# Patient Record
Sex: Male | Born: 1965
Health system: Southern US, Community
[De-identification: ages and names within clinical notes are randomized; demographics above are authoritative.]

## PROBLEM LIST (undated history)

## (undated) DIAGNOSIS — N289 Disorder of kidney and ureter, unspecified: Secondary | ICD-10-CM

## (undated) DIAGNOSIS — I10 Essential (primary) hypertension: Secondary | ICD-10-CM

---

## 2018-04-29 ENCOUNTER — Encounter (HOSPITAL_COMMUNITY): Payer: Self-pay

## 2018-04-29 ENCOUNTER — Emergency Department (HOSPITAL_COMMUNITY): Payer: Managed Care, Other (non HMO)

## 2018-04-29 ENCOUNTER — Other Ambulatory Visit: Payer: Self-pay

## 2018-04-29 ENCOUNTER — Emergency Department (HOSPITAL_COMMUNITY)
Admission: EM | Admit: 2018-04-29 | Discharge: 2018-04-30 | Disposition: A | Discharge status: Home / Self Care | Payer: Managed Care, Other (non HMO) | Attending: Emergency Medicine | Admitting: Emergency Medicine

## 2018-04-29 DIAGNOSIS — I10 Essential (primary) hypertension: Secondary | ICD-10-CM | POA: Insufficient documentation

## 2018-04-29 DIAGNOSIS — R7989 Other specified abnormal findings of blood chemistry: Secondary | ICD-10-CM | POA: Diagnosis not present

## 2018-04-29 DIAGNOSIS — R109 Unspecified abdominal pain: Secondary | ICD-10-CM | POA: Diagnosis present

## 2018-04-29 DIAGNOSIS — N201 Calculus of ureter: Secondary | ICD-10-CM | POA: Insufficient documentation

## 2018-04-29 HISTORY — DX: Essential (primary) hypertension: I10

## 2018-04-29 LAB — CBC WITH DIFFERENTIAL/PLATELET
ABS IMMATURE GRANULOCYTES: 0.1 10*3/uL (ref 0.0–0.1)
Basophils Absolute: 0.1 10*3/uL (ref 0.0–0.1)
Basophils Relative: 0 %
Eosinophils Absolute: 0.1 10*3/uL (ref 0.0–0.7)
Eosinophils Relative: 1 %
HEMATOCRIT: 43.7 % (ref 39.0–52.0)
HEMOGLOBIN: 14.7 g/dL (ref 13.0–17.0)
IMMATURE GRANULOCYTES: 0 %
LYMPHS ABS: 2.4 10*3/uL (ref 0.7–4.0)
LYMPHS PCT: 17 %
MCH: 31.5 pg (ref 26.0–34.0)
MCHC: 33.6 g/dL (ref 30.0–36.0)
MCV: 93.8 fL (ref 78.0–100.0)
MONOS PCT: 7 %
Monocytes Absolute: 1 10*3/uL (ref 0.1–1.0)
NEUTROS ABS: 10 10*3/uL — AB (ref 1.7–7.7)
NEUTROS PCT: 75 %
Platelets: 265 10*3/uL (ref 150–400)
RBC: 4.66 MIL/uL (ref 4.22–5.81)
RDW: 11.9 % (ref 11.5–15.5)
WBC: 13.5 10*3/uL — AB (ref 4.0–10.5)

## 2018-04-29 LAB — COMPREHENSIVE METABOLIC PANEL
ALT: 43 U/L (ref 0–44)
ANION GAP: 8 (ref 5–15)
AST: 30 U/L (ref 15–41)
Albumin: 4 g/dL (ref 3.5–5.0)
Alkaline Phosphatase: 57 U/L (ref 38–126)
BUN: 19 mg/dL (ref 6–20)
CO2: 22 mmol/L (ref 22–32)
Calcium: 9.2 mg/dL (ref 8.9–10.3)
Chloride: 107 mmol/L (ref 98–111)
Creatinine, Ser: 1.89 mg/dL — ABNORMAL HIGH (ref 0.61–1.24)
GFR, EST AFRICAN AMERICAN: 46 mL/min — AB (ref >60)
GFR, EST NON AFRICAN AMERICAN: 40 mL/min — AB (ref >60)
GLUCOSE: 128 mg/dL — AB (ref 70–99)
POTASSIUM: 4 mmol/L (ref 3.5–5.1)
SODIUM: 137 mmol/L (ref 135–145)
TOTAL PROTEIN: 6.2 g/dL — AB (ref 6.5–8.1)
Total Bilirubin: 0.5 mg/dL (ref 0.3–1.2)

## 2018-04-29 LAB — URINALYSIS, ROUTINE W REFLEX MICROSCOPIC
BILIRUBIN URINE: NEGATIVE
GLUCOSE, UA: NEGATIVE mg/dL
Ketones, ur: NEGATIVE mg/dL
LEUKOCYTES UA: NEGATIVE
NITRITE: NEGATIVE
PROTEIN: 100 mg/dL — AB
Specific Gravity, Urine: 1.025 (ref 1.005–1.030)
pH: 6 (ref 5.0–8.0)

## 2018-04-29 MED ORDER — OXYCODONE-ACETAMINOPHEN 5-325 MG PO TABS
2.0000 | ORAL_TABLET | Freq: Once | ORAL | Status: DC
  Filled 2018-04-29: qty 2

## 2018-04-29 MED ORDER — ONDANSETRON 4 MG PO TBDP: 8.0000 mg | ORAL_TABLET | Freq: Once | ORAL | Status: DC

## 2018-04-29 MED ORDER — SODIUM CHLORIDE 0.9 % IV BOLUS
500.0000 mL | Freq: Once | INTRAVENOUS | Status: AC
  Administered 2018-04-29: 500 mL via INTRAVENOUS

## 2018-04-29 MED ORDER — HYDROMORPHONE HCL 1 MG/ML IJ SOLN: 1.0000 mg | Freq: Once | INTRAMUSCULAR | Status: DC

## 2018-04-29 MED ORDER — HYDROMORPHONE HCL 1 MG/ML IJ SOLN
1.0000 mg | Freq: Once | INTRAMUSCULAR | Status: AC
  Administered 2018-04-29: 1 mg via INTRAVENOUS
  Filled 2018-04-29: qty 1

## 2018-04-29 MED ORDER — ONDANSETRON HCL 4 MG/2ML IJ SOLN
4.0000 mg | Freq: Once | INTRAMUSCULAR | Status: AC
  Administered 2018-04-29: 4 mg via INTRAVENOUS
  Filled 2018-04-29: qty 2

## 2018-04-29 NOTE — ED Provider Notes (Signed)
[facility] EMERGENCY DEPARTMENT Provider Note   CSN: [id number] Arrival date & time: 04/29/18  2030    History   Chief Complaint Chief Complaint  Patient presents with  . Flank Pain  . Hematuria    HPI Corey Landry is a 52 y.o. male.   52 year old male presents to the emergency department for evaluation of left flank pain.  States that he had pain initially on Saturday which lasted for approximately 45 minutes before spontaneously resolving.  Pain began again today at 1400.  It has been constant since this time.  He has taken ibuprofen for symptoms without relief.  Did notice some hematuria at home.  Further reports some discomfort at the end of voiding.  Denies any fevers, vomiting, history of abdominal surgeries.  Mother with history of kidney stones.     Past Medical History:  Diagnosis Date  . Hypertension     There are no active problems to display for this patient.   History reviewed. No pertinent surgical history.      Home Medications    Prior to Admission medications   Medication Sig Start Date End Date Taking? Authorizing Provider  oxyCODONE-acetaminophen (PERCOCET/ROXICET) 5-325 MG tablet Take 1-2 tablets by mouth every 6 (six) hours as needed for severe pain. 04/30/18   Antony Madura, PA-C  promethazine (PHENERGAN) 25 MG tablet Take 1 tablet (25 mg total) by mouth every 6 (six) hours as needed for nausea or vomiting. 04/30/18   Antony Madura, PA-C  tamsulosin (FLOMAX) 0.4 MG CAPS capsule Take 1 capsule (0.4 mg total) by mouth daily. 04/30/18   Antony Madura, PA-C    Family History History reviewed. No pertinent family history.  Social History Social History   Tobacco Use  . Smoking status: Never Smoker  Substance Use Topics  . Alcohol use: Not on file  . Drug use: Never     Allergies   Penicillins   Review of Systems Review of Systems Ten systems reviewed and are negative for acute change, except as noted in the HPI.     Physical Exam Updated Vital Signs BP (!) 153/94 (BP Location: Right Arm)   Pulse 69   Temp (!) 97.5 F (36.4 C) (Oral)   Resp 20   Ht 5\' 9"  (1.753 m)   Wt 99.8 kg   SpO2 100%   BMI 32.49 kg/m   Physical Exam  Constitutional: He is oriented to person, place, and time. He appears well-developed and well-nourished. No distress.  Nontoxic appearing and in NAD  HENT:  Head: Normocephalic and atraumatic.  Eyes: Conjunctivae and EOM are normal. No scleral icterus.  Neck: Normal range of motion.  Cardiovascular: Normal rate, regular rhythm and intact distal pulses.  Pulmonary/Chest: Effort normal. No stridor. No respiratory distress.  Respirations even and unlabored  Abdominal: Soft. He exhibits no mass.  Soft, obese abdomen. Mild L abdominal tenderness.  Musculoskeletal: Normal range of motion.  Neurological: He is alert and oriented to person, place, and time. He exhibits normal muscle tone. Coordination normal.  Skin: Skin is warm and dry. No rash noted. He is not diaphoretic. No erythema. No pallor.  Psychiatric: He has a normal mood and affect. His behavior is normal.  Nursing note and vitals reviewed.    ED Treatments / Results  Labs (all labs ordered are listed, but only abnormal results are displayed) Labs Reviewed  CBC WITH DIFFERENTIAL/PLATELET - Abnormal; Notable for the following components:      Result Value   WBC  13.5 (*)    Neutro Abs 10.0 (*)    All other components within normal limits  URINALYSIS, ROUTINE W REFLEX MICROSCOPIC - Abnormal; Notable for the following components:   APPearance CLOUDY (*)    Hgb urine dipstick LARGE (*)    Protein, ur 100 (*)    RBC / HPF >50 (*)    Bacteria, UA RARE (*)    All other components within normal limits  COMPREHENSIVE METABOLIC PANEL - Abnormal; Notable for the following components:   Glucose, Bld 128 (*)    Creatinine, Ser 1.89 (*)    Total Protein 6.2 (*)    GFR calc non Af Amer 40 (*)    GFR calc Af Amer  46 (*)    All other components within normal limits    EKG None  Radiology Ct Renal Stone Study  Result Date: 04/29/2018 CLINICAL DATA:  Left flank pain x2 days EXAM: CT ABDOMEN AND PELVIS WITHOUT CONTRAST TECHNIQUE: Multidetector CT imaging of the abdomen and pelvis was performed following the standard protocol without IV contrast. COMPARISON:  None. FINDINGS: Lower chest: Mild scarring/atelectasis in the medial right middle lobe. Hepatobiliary: Moderate hepatic steatosis. Gallbladder is unremarkable. No intrahepatic or extrahepatic ductal dilatation. Pancreas: Within normal limits. Spleen: Within normal limits. Adrenals/Urinary Tract: Adrenal glands are within normal limits. Right kidney is within normal limits. Punctate nonobstructing calculus in the left upper kidney (coronal image 75). Mild left hydroureteronephrosis. Associated 5 x 3 mm distal left ureteral calculus at the UVJ (series 3/image 86). Bladder is underdistended. Stomach/Bowel: Stomach is within normal limits. No evidence of bowel obstruction. Normal appendix (series 3/image 57). Mild sigmoid diverticulosis, without evidence of diverticulitis. Vascular/Lymphatic: No evidence of abdominal aortic aneurysm. Atherosclerotic calcifications of the abdominal aorta and branch vessels. No suspicious abdominopelvic lymphadenopathy. Reproductive: Prostate is unremarkable. Other: No abdominopelvic ascites. Tiny fat containing bilateral inguinal hernias (series 3/image 90). Musculoskeletal: Visualized osseous structures are within normal limits. IMPRESSION: 5 x 3 mm distal left ureteral calculus at the UVJ. Mild left hydroureteronephrosis. Additional punctate nonobstructing left upper pole renal calculus. Moderate hepatic steatosis. Electronically Signed   By: Charline Bills M.D.   On: 04/29/2018 21:56    Procedures Procedures (including critical care time)  Medications Ordered in ED Medications  HYDROmorphone (DILAUDID) 1 MG/ML injection  (has no administration in time range)  ketorolac (TORADOL) 30 MG/ML injection (has no administration in time range)  HYDROmorphone (DILAUDID) injection 1 mg (1 mg Intravenous Given 04/29/18 2347)  sodium chloride 0.9 % bolus 500 mL (0 mLs Intravenous Stopped 04/30/18 0107)  ondansetron (ZOFRAN) injection 4 mg (4 mg Intravenous Given 04/29/18 2347)  HYDROmorphone (DILAUDID) injection 1 mg (1 mg Intravenous Given 04/30/18 0132)  ketorolac (TORADOL) 30 MG/ML injection 15 mg (15 mg Intravenous Given 04/30/18 0130)    1:30 AM Pain improved to 4/10, but requesting additional medications. Will give low dose Toradol and additional IV Dilaudid.  1:48 AM Pain has improved to 2/10. Expresses comfort with discharge and outpatient follow up   Initial Impression / Assessment and Plan / ED Course  I have reviewed the triage vital signs and the nursing notes.  Pertinent labs & imaging results that were available during my care of the patient were reviewed by me and considered in my medical decision making (see chart for details).     Patient has been diagnosed with a kidney stone via CT. There is no evidence of significant hydronephrosis, vitals sign stable and the pt does not have irratractable vomiting.  Creatinine is elevated, but with unknown baseline.  Likely partially related to obstructing ureterolithiasis.  Will have patient have this rechecked by his PCP as an outpatient.  Patient will be discharged home with pain medications and Flomax.  Return precautions discussed and provided.  Patient discharged in stable condition with no unaddressed concerns.   Final Clinical Impressions(s) / ED Diagnoses   Final diagnoses:  Left flank pain  Ureterolithiasis  Elevated serum creatinine    ED Discharge Orders         Ordered    tamsulosin (FLOMAX) 0.4 MG CAPS capsule  Daily     04/30/18 0146    oxyCODONE-acetaminophen (PERCOCET/ROXICET) 5-325 MG tablet  Every 6 hours PRN     04/30/18 0146     promethazine (PHENERGAN) 25 MG tablet  Every 6 hours PRN     04/30/18 0146           Antony Madura, PA-C 04/30/18 0151    Nira Conn, MD 04/30/18 1645

## 2018-04-29 NOTE — ED Provider Notes (Signed)
Patient placed in Quick Look pathway, seen and evaluated   Chief Complaint: Flank pain  HPI:   Sophie Quiles is a 52 y.o. male who presents to ED for left flank pain x 2 days. Initially pain would come-and-go, but today, pain has been constant. Tried ibuprofen with no improvement.   ROS: + flank pain, dysuria, hematuria  Physical Exam:   Gen: No distress  Neuro: Awake and Alert  Skin: Warm    Focused Exam: Tenderness to left flank. No CVA tenderness.    Initiation of care has begun. The patient has been counseled on the process, plan, and necessity for staying for the completion/evaluation, and the remainder of the medical screening examination    Ward, Chase Picket, PA-C 04/29/18 1610    Gwyneth Sprout, MD 04/30/18 [phone]

## 2018-04-30 MED ORDER — PROMETHAZINE HCL 25 MG PO TABS: 25.0000 mg | ORAL_TABLET | Freq: Four times a day (QID) | ORAL | 0 refills | Status: DC | PRN

## 2018-04-30 MED ORDER — KETOROLAC TROMETHAMINE 30 MG/ML IJ SOLN
INTRAMUSCULAR | Status: AC
  Filled 2018-04-30: qty 1

## 2018-04-30 MED ORDER — TAMSULOSIN HCL 0.4 MG PO CAPS: 0.4000 mg | ORAL_CAPSULE | Freq: Every day | ORAL | 0 refills | Status: DC

## 2018-04-30 MED ORDER — KETOROLAC TROMETHAMINE 30 MG/ML IJ SOLN
15.0000 mg | Freq: Once | INTRAMUSCULAR | Status: AC
  Administered 2018-04-30: 15 mg via INTRAVENOUS

## 2018-04-30 MED ORDER — HYDROMORPHONE HCL 1 MG/ML IJ SOLN
1.0000 mg | Freq: Once | INTRAMUSCULAR | Status: AC
  Administered 2018-04-30: 1 mg via INTRAVENOUS

## 2018-04-30 MED ORDER — HYDROMORPHONE HCL 1 MG/ML IJ SOLN
INTRAMUSCULAR | Status: AC
  Filled 2018-04-30: qty 1

## 2018-04-30 MED ORDER — OXYCODONE-ACETAMINOPHEN 5-325 MG PO TABS: 1.0000 | ORAL_TABLET | Freq: Four times a day (QID) | ORAL | 0 refills | Status: DC | PRN

## 2018-04-30 NOTE — Discharge Instructions (Addendum)
We recommend that you take Flomax to promote stone movement.  You have been prescribed Percocet to take for management of pain.  Do not drive or drink alcohol after taking this medication as it may make you drowsy and impair your judgment.  Your kidney function was elevated today.  This should be followed by your primary care doctor.  Follow-up for recheck of your kidney function in 3 to 5 days.  If you continue to have persistent pain from your kidney stone, we advise follow-up with urology.  You may also return to the ED for new or concerning symptoms.

## 2019-01-04 ENCOUNTER — Other Ambulatory Visit: Payer: Self-pay

## 2019-01-04 ENCOUNTER — Encounter (HOSPITAL_COMMUNITY): Payer: Self-pay | Admitting: Oncology

## 2019-01-04 ENCOUNTER — Inpatient Hospital Stay (HOSPITAL_COMMUNITY)
Admission: EM | Admit: 2019-01-04 | Discharge: 2019-01-08 | DRG: 493 | Disposition: A | Discharge status: Home / Self Care | Payer: Managed Care, Other (non HMO) | Attending: Student | Admitting: Student

## 2019-01-04 ENCOUNTER — Emergency Department (HOSPITAL_COMMUNITY): Payer: Managed Care, Other (non HMO)

## 2019-01-04 DIAGNOSIS — W11XXXA Fall on and from ladder, initial encounter: Secondary | ICD-10-CM | POA: Diagnosis present

## 2019-01-04 DIAGNOSIS — I10 Essential (primary) hypertension: Secondary | ICD-10-CM | POA: Diagnosis present

## 2019-01-04 DIAGNOSIS — T148XXA Other injury of unspecified body region, initial encounter: Secondary | ICD-10-CM

## 2019-01-04 DIAGNOSIS — Z1159 Encounter for screening for other viral diseases: Secondary | ICD-10-CM

## 2019-01-04 DIAGNOSIS — Z87442 Personal history of urinary calculi: Secondary | ICD-10-CM

## 2019-01-04 DIAGNOSIS — S82142A Displaced bicondylar fracture of left tibia, initial encounter for closed fracture: Principal | ICD-10-CM | POA: Diagnosis present

## 2019-01-04 DIAGNOSIS — W19XXXA Unspecified fall, initial encounter: Secondary | ICD-10-CM

## 2019-01-04 DIAGNOSIS — E559 Vitamin D deficiency, unspecified: Secondary | ICD-10-CM | POA: Diagnosis present

## 2019-01-04 DIAGNOSIS — Z880 Allergy status to penicillin: Secondary | ICD-10-CM

## 2019-01-04 DIAGNOSIS — S82143A Displaced bicondylar fracture of unspecified tibia, initial encounter for closed fracture: Secondary | ICD-10-CM

## 2019-01-04 DIAGNOSIS — F17210 Nicotine dependence, cigarettes, uncomplicated: Secondary | ICD-10-CM | POA: Diagnosis present

## 2019-01-04 DIAGNOSIS — Z419 Encounter for procedure for purposes other than remedying health state, unspecified: Secondary | ICD-10-CM

## 2019-01-04 DIAGNOSIS — D62 Acute posthemorrhagic anemia: Secondary | ICD-10-CM | POA: Diagnosis not present

## 2019-01-04 HISTORY — DX: Disorder of kidney and ureter, unspecified: N28.9

## 2019-01-04 LAB — CBC WITH DIFFERENTIAL/PLATELET
Abs Immature Granulocytes: 0.07 10*3/uL (ref 0.00–0.07)
Basophils Absolute: 0.1 10*3/uL (ref 0.0–0.1)
Basophils Relative: 0 %
Eosinophils Absolute: 0 10*3/uL (ref 0.0–0.5)
Eosinophils Relative: 0 %
HCT: 44.8 % (ref 39.0–52.0)
Hemoglobin: 14.9 g/dL (ref 13.0–17.0)
Immature Granulocytes: 1 %
Lymphocytes Relative: 13 %
Lymphs Abs: 1.7 10*3/uL (ref 0.7–4.0)
MCH: 30.9 pg (ref 26.0–34.0)
MCHC: 33.3 g/dL (ref 30.0–36.0)
MCV: 92.9 fL (ref 80.0–100.0)
Monocytes Absolute: 0.9 10*3/uL (ref 0.1–1.0)
Monocytes Relative: 7 %
Neutro Abs: 10.4 10*3/uL — ABNORMAL HIGH (ref 1.7–7.7)
Neutrophils Relative %: 79 %
Platelets: 267 10*3/uL (ref 150–400)
RBC: 4.82 MIL/uL (ref 4.22–5.81)
RDW: 12.4 % (ref 11.5–15.5)
WBC: 13.2 10*3/uL — ABNORMAL HIGH (ref 4.0–10.5)
nRBC: 0 % (ref 0.0–0.2)

## 2019-01-04 LAB — BASIC METABOLIC PANEL
Anion gap: 8 (ref 5–15)
BUN: 14 mg/dL (ref 6–20)
CO2: 24 mmol/L (ref 22–32)
Calcium: 9.2 mg/dL (ref 8.9–10.3)
Chloride: 104 mmol/L (ref 98–111)
Creatinine, Ser: 1.56 mg/dL — ABNORMAL HIGH (ref 0.61–1.24)
GFR calc Af Amer: 59 mL/min — ABNORMAL LOW (ref >60)
GFR calc non Af Amer: 51 mL/min — ABNORMAL LOW (ref >60)
Glucose, Bld: 122 mg/dL — ABNORMAL HIGH (ref 70–99)
Potassium: 4.1 mmol/L (ref 3.5–5.1)
Sodium: 136 mmol/L (ref 135–145)

## 2019-01-04 LAB — PROTIME-INR
INR: 1.2 (ref 0.8–1.2)
Prothrombin Time: 14.9 seconds (ref 11.4–15.2)

## 2019-01-04 MED ORDER — FENTANYL CITRATE (PF) 100 MCG/2ML IJ SOLN
50.0000 ug | INTRAMUSCULAR | Status: DC | PRN
  Administered 2019-01-05: 50 ug via INTRAVENOUS
  Filled 2019-01-04: qty 2

## 2019-01-04 MED ORDER — HYDROMORPHONE HCL 1 MG/ML IJ SOLN
1.0000 mg | Freq: Once | INTRAMUSCULAR | Status: AC
  Administered 2019-01-04: 1 mg via INTRAVENOUS
  Filled 2019-01-04: qty 1

## 2019-01-04 MED ORDER — SODIUM CHLORIDE 0.9 % IV BOLUS
500.0000 mL | Freq: Once | INTRAVENOUS | Status: AC
  Administered 2019-01-04: 500 mL via INTRAVENOUS

## 2019-01-04 MED ORDER — ONDANSETRON HCL 4 MG/2ML IJ SOLN
4.0000 mg | Freq: Once | INTRAMUSCULAR | Status: AC
  Administered 2019-01-04: 4 mg via INTRAVENOUS
  Filled 2019-01-04: qty 2

## 2019-01-04 MED ORDER — HYDROMORPHONE HCL 1 MG/ML IJ SOLN
1.0000 mg | Freq: Once | INTRAMUSCULAR | Status: AC
  Administered 2019-01-04: 23:00:00 1 mg via INTRAVENOUS
  Filled 2019-01-04: qty 1

## 2019-01-04 NOTE — ED Notes (Signed)
Patient transported to X-ray 

## 2019-01-04 NOTE — ED Triage Notes (Signed)
Pt bib [facility] after fall from ladder at approximately 10-12 feet.  Pt landed on left side.  Denies hitting head or LOC.  Pt has obvious deformity to left knee.  Pt given total of 200 mcg of fentanyl en route.  Pt is A&O x 4.

## 2019-01-04 NOTE — ED Provider Notes (Signed)
[facility] Provider [facility]   CSN: [id number] Arrival date & time: 01/04/19  1935    History   Chief Complaint Chief Complaint  Patient presents with  . Fall    HPI Corey Landry is a 53 y.o. male.     The history is provided by the patient, medical records and the EMS personnel.  Fall  This is a new problem. The current episode started 1 to 2 hours ago. The problem occurs constantly. The problem has not changed since onset.Pertinent negatives include no chest pain, no abdominal pain, no headaches and no shortness of breath. The symptoms are aggravated by exertion, standing, bending, twisting and stress (pressure over injury ). The symptoms are relieved by position, NSAIDs, narcotics and rest. He has tried a cold compress (fentanyl ) for the symptoms. The treatment provided mild relief.    Past Medical History:  Diagnosis Date  . Hypertension   . Renal disorder    kidney stones    There are no active problems to display for this patient.   History reviewed. No pertinent surgical history.      Home Medications    Prior to Admission medications   Medication Sig Start Date End Date Taking? Authorizing Provider  oxyCODONE-acetaminophen (PERCOCET/ROXICET) 5-325 MG tablet Take 1-2 tablets by mouth every 6 (six) hours as needed for severe pain. 04/30/18   Antony MaduraHumes, Kelly, PA-C  promethazine (PHENERGAN) 25 MG tablet Take 1 tablet (25 mg total) by mouth every 6 (six) hours as needed for nausea or vomiting. 04/30/18   Antony MaduraHumes, Kelly, PA-C  tamsulosin (FLOMAX) 0.4 MG CAPS capsule Take 1 capsule (0.4 mg total) by mouth daily. 04/30/18   Antony MaduraHumes, Kelly, PA-C    Family History No family history on file.  Social History Social History   Tobacco Use  . Smoking status: Current Every Day Smoker    Packs/day: 0.50    Years: 30.00    Pack years: 15.00    Types: Cigarettes  . Smokeless tobacco: Never Used  Substance Use Topics  . Alcohol use: Not  Currently  . Drug use: Never     Allergies   Penicillins   Review of Systems Review of Systems  Respiratory: Negative for shortness of breath.   Cardiovascular: Negative for chest pain.  Gastrointestinal: Negative for abdominal pain.  Neurological: Negative for headaches.  All other systems reviewed and are negative.    Physical Exam Updated Vital Signs BP 112/73   Pulse (!) 105   Temp 97.6 F (36.4 C) (Oral)   Resp 18   Ht 5\' 9"  (1.753 m)   Wt 102.1 kg   SpO2 94%   BMI 33.23 kg/m   Physical Exam Vitals signs and nursing note reviewed.  Constitutional:      Appearance: He is well-developed.  HENT:     Head: Normocephalic and atraumatic.     Comments: No obvious palpable deformities concerning for skull fracture    Mouth/Throat:     Mouth: Mucous membranes are moist.  Eyes:     Conjunctiva/sclera: Conjunctivae normal.  Neck:     Musculoskeletal: Neck supple. No neck rigidity or muscular tenderness.     Comments: No obvious midline tenderness cervical, thoracic, lumbar spine No obvious step-offs or deformities cervical, thoracic, lumbar spine Cardiovascular:     Comments: Heart rate 104 bpm Pulmonary:     Effort: Pulmonary effort is normal.     Breath sounds: No wheezing.  Chest:     Chest wall:  No tenderness.  Abdominal:     Palpations: Abdomen is soft.     Tenderness: There is no abdominal tenderness. There is no guarding or rebound.  Musculoskeletal:        General: Swelling, tenderness and signs of injury present.     Comments: Injury to left knee, tenderness with palpation of left knee anterior, lateral, 2+ pedal pulses bilaterally, sensation intact bilateral lower extremities, range of motion and motor strength left lower extremity limited secondary to pain  Skin:    General: Skin is warm and dry.     Findings: Bruising present.  Neurological:     Mental Status: He is alert and oriented to person, place, and time.      ED Treatments / Results   Labs (all labs ordered are listed, but only abnormal results are displayed) Labs Reviewed  CBC WITH DIFFERENTIAL/PLATELET - Abnormal; Notable for the following components:      Result Value   WBC 13.2 (*)    Neutro Abs 10.4 (*)    All other components within normal limits  BASIC METABOLIC PANEL - Abnormal; Notable for the following components:   Glucose, Bld 122 (*)    Creatinine, Ser 1.56 (*)    GFR calc non Af Amer 51 (*)    GFR calc Af Amer 59 (*)    All other components within normal limits  SARS CORONAVIRUS 2 (HOSPITAL ORDER, [place] [facility])  [place]    EKG None  Radiology Dg Knee 2 Views Left  Result Date: 01/04/2019 CLINICAL DATA:  Lateral fall from 10-12 feet landing on left side. EXAM: LEFT KNEE - 1-2 VIEW COMPARISON:  None. FINDINGS: Exam demonstrates a displaced comminuted fracture of the proximal tibia predominately involving the lateral plateau and extending into the metaphysis. There is depression of the lateral plateau. Remainder the exam is unremarkable. IMPRESSION: Displaced comminuted fracture of the proximal tibia with involvement of the lateral plateau extending into the metaphysis. Electronically Signed   By: Marin Olp M.D.   On: 01/04/2019 21:49   Dg Tibia/fibula Left  Result Date: 01/04/2019 CLINICAL DATA:  Lateral fall from 10-12 feet landing on left side. EXAM: LEFT TIBIA AND FIBULA - 2 VIEW COMPARISON:  None. FINDINGS: There is a displaced comminuted fracture of the proximal tibia involving predominately the lateral plateau and extending into the metaphyseal region. Remainder of the lower leg is within normal. IMPRESSION: Displaced comminuted fracture of the proximal tibia with involvement of the lateral plateau extending into the metaphysis. Electronically Signed   By: Marin Olp M.D.   On: 01/04/2019 21:48   Dg Ankle 2 Views Left  Result Date: 01/04/2019 CLINICAL DATA:  Lateral fall from 10-12 feet landing on left side. EXAM:  LEFT ANKLE - 2 VIEW COMPARISON:  None. FINDINGS: Ankle mortise is normal. No evidence of acute fracture or dislocation. IMPRESSION: No acute findings. Electronically Signed   By: Marin Olp M.D.   On: 01/04/2019 21:50   Dg Chest Portable 1 View  Result Date: 01/04/2019 CLINICAL DATA:  Lateral fall from 10-12 feet landing on left side. EXAM: PORTABLE CHEST 1 VIEW COMPARISON:  None. FINDINGS: Lungs are adequately inflated and otherwise clear. Cardiomediastinal silhouette is normal. No evidence of fracture. IMPRESSION: No acute findings. Electronically Signed   By: Marin Olp M.D.   On: 01/04/2019 21:50   Dg Hip Unilat W Or Wo Pelvis 2-3 Views Left  Result Date: 01/04/2019 CLINICAL DATA:  Lateral fall from 10-12 feet today landing  on left side. Left knee deformity. EXAM: DG HIP (WITH OR WITHOUT PELVIS) 2-3V LEFT COMPARISON:  None. FINDINGS: There is no evidence of hip fracture or dislocation. There is no evidence of arthropathy or other focal bone abnormality. IMPRESSION: Negative. Electronically Signed   By: Elberta Fortisaniel  Boyle M.D.   On: 01/04/2019 21:46    Procedures Procedures (including critical care time)  Medications Ordered in ED Medications  fentaNYL (SUBLIMAZE) injection 50 mcg (has no administration in time range)  HYDROmorphone (DILAUDID) injection 1 mg (1 mg Intravenous Given 01/04/19 2110)  ondansetron (ZOFRAN) injection 4 mg (4 mg Intravenous Given 01/04/19 2110)  sodium chloride 0.9 % bolus 500 mL (0 mLs Intravenous Stopped 01/04/19 2317)  HYDROmorphone (DILAUDID) injection 1 mg (1 mg Intravenous Given 01/04/19 2326)     Initial Impression / Assessment and Plan / ED Course  I have reviewed the triage vital signs and the nursing notes.  Pertinent labs & imaging results that were available during my care of the patient were reviewed by me and considered in my medical decision making (see chart for details).        Medical Decision Making: Corey LoganSteve Arch is a 53 y.o. male who presented  to the ED today status post fall off of ladder.  Past medical history significant for hypertension Reviewed and confirmed nursing documentation for past medical history, family history, social history.  On my initial exam, the pt was appears uncomfortable, tachycardic, left knee held in slight flexion, not hypotensive, no increased work of breathing or respiratory distress, GCS 15.  Yard work earlier today, fell off of ladder approximately 8 feet, landed on left leg, medial leg pain decreased range of motion left lower extremity at the knee Compartments of lower extremity soft, sensation intact throughout, good pulses distally, patient able to move toes spontaneously Plain films revealed displaced comminuted fracture of proximal tibia with lateral plateau extending into the metaphysis Touched Base with orthopedics, will obtain CT imaging, will admit patient for further evaluation and care All radiology and laboratory studies reviewed independently and with my attending physician, agree with reading provided by radiologist unless otherwise noted.  Upon reassessing patient, patient was calm, resting comfortably, waiting CT imaging. Based on the above findings, I believe patient requires admission. The above care was discussed with and agreed upon by my attending physician.  Emergency Department Medication Summary:  Medications  fentaNYL (SUBLIMAZE) injection 50 mcg (has no administration in time range)  HYDROmorphone (DILAUDID) injection 1 mg (1 mg Intravenous Given 01/04/19 2110)  ondansetron (ZOFRAN) injection 4 mg (4 mg Intravenous Given 01/04/19 2110)  sodium chloride 0.9 % bolus 500 mL (0 mLs Intravenous Stopped 01/04/19 2317)  HYDROmorphone (DILAUDID) injection 1 mg (1 mg Intravenous Given 01/04/19 2326)        Final Clinical Impressions(s) / ED Diagnoses   Final diagnoses:  None    ED Discharge Orders    None       Erick Alleyasey, Stefanie Hodgens, MD 01/04/19 [id number]    Blane OharaZavitz, Joshua, MD 01/05/19  [phone]

## 2019-01-05 DIAGNOSIS — E559 Vitamin D deficiency, unspecified: Secondary | ICD-10-CM | POA: Diagnosis present

## 2019-01-05 DIAGNOSIS — D62 Acute posthemorrhagic anemia: Secondary | ICD-10-CM | POA: Diagnosis not present

## 2019-01-05 DIAGNOSIS — I10 Essential (primary) hypertension: Secondary | ICD-10-CM | POA: Diagnosis present

## 2019-01-05 DIAGNOSIS — Z880 Allergy status to penicillin: Secondary | ICD-10-CM | POA: Diagnosis not present

## 2019-01-05 DIAGNOSIS — S82142A Displaced bicondylar fracture of left tibia, initial encounter for closed fracture: Secondary | ICD-10-CM | POA: Diagnosis present

## 2019-01-05 DIAGNOSIS — M7989 Other specified soft tissue disorders: Secondary | ICD-10-CM | POA: Diagnosis not present

## 2019-01-05 DIAGNOSIS — F17210 Nicotine dependence, cigarettes, uncomplicated: Secondary | ICD-10-CM | POA: Diagnosis present

## 2019-01-05 DIAGNOSIS — Z1159 Encounter for screening for other viral diseases: Secondary | ICD-10-CM | POA: Diagnosis not present

## 2019-01-05 DIAGNOSIS — Z9889 Other specified postprocedural states: Secondary | ICD-10-CM | POA: Diagnosis not present

## 2019-01-05 DIAGNOSIS — W11XXXA Fall on and from ladder, initial encounter: Secondary | ICD-10-CM | POA: Diagnosis present

## 2019-01-05 DIAGNOSIS — Z87442 Personal history of urinary calculi: Secondary | ICD-10-CM | POA: Diagnosis not present

## 2019-01-05 LAB — SARS CORONAVIRUS 2 BY RT PCR (HOSPITAL ORDER, PERFORMED IN ~~LOC~~ HOSPITAL LAB): SARS Coronavirus 2: NEGATIVE

## 2019-01-05 MED ORDER — ONDANSETRON HCL 4 MG PO TABS: 4.0000 mg | ORAL_TABLET | Freq: Four times a day (QID) | ORAL | Status: DC | PRN

## 2019-01-05 MED ORDER — POLYETHYLENE GLYCOL 3350 17 G PO PACK: 17.0000 g | PACK | Freq: Every day | ORAL | Status: DC | PRN

## 2019-01-05 MED ORDER — PHENOL 1.4 % MT LIQD: 1.0000 | OROMUCOSAL | Status: DC | PRN

## 2019-01-05 MED ORDER — ACETAMINOPHEN 325 MG PO TABS: 325.0000 mg | ORAL_TABLET | Freq: Four times a day (QID) | ORAL | Status: DC | PRN

## 2019-01-05 MED ORDER — MENTHOL 3 MG MT LOZG: 1.0000 | LOZENGE | OROMUCOSAL | Status: DC | PRN

## 2019-01-05 MED ORDER — BISACODYL 10 MG RE SUPP: 10.0000 mg | Freq: Every day | RECTAL | Status: DC | PRN

## 2019-01-05 MED ORDER — METOCLOPRAMIDE HCL 5 MG/ML IJ SOLN: 5.0000 mg | Freq: Three times a day (TID) | INTRAMUSCULAR | Status: DC | PRN

## 2019-01-05 MED ORDER — DOCUSATE SODIUM 100 MG PO CAPS
100.0000 mg | ORAL_CAPSULE | Freq: Two times a day (BID) | ORAL | Status: DC
  Administered 2019-01-06 – 2019-01-08 (×4): 100 mg via ORAL
  Filled 2019-01-05 (×6): qty 1

## 2019-01-05 MED ORDER — METOCLOPRAMIDE HCL 5 MG PO TABS: 5.0000 mg | ORAL_TABLET | Freq: Three times a day (TID) | ORAL | Status: DC | PRN

## 2019-01-05 MED ORDER — HYDROMORPHONE HCL 1 MG/ML IJ SOLN
0.5000 mg | INTRAMUSCULAR | Status: DC | PRN
  Administered 2019-01-05 – 2019-01-06 (×5): 1 mg via INTRAVENOUS
  Filled 2019-01-05 (×5): qty 1

## 2019-01-05 MED ORDER — METHOCARBAMOL 500 MG PO TABS
500.0000 mg | ORAL_TABLET | Freq: Four times a day (QID) | ORAL | Status: DC | PRN
  Administered 2019-01-05 – 2019-01-07 (×4): 500 mg via ORAL
  Filled 2019-01-05 (×4): qty 1

## 2019-01-05 MED ORDER — SODIUM CHLORIDE 0.9 % IV SOLN
INTRAVENOUS | Status: DC
  Administered 2019-01-05: 75 mL/h via INTRAVENOUS
  Administered 2019-01-05 – 2019-01-06 (×2): via INTRAVENOUS

## 2019-01-05 MED ORDER — ONDANSETRON HCL 4 MG/2ML IJ SOLN
4.0000 mg | Freq: Four times a day (QID) | INTRAMUSCULAR | Status: DC | PRN
  Administered 2019-01-06: 4 mg via INTRAVENOUS

## 2019-01-05 MED ORDER — OXYCODONE HCL 5 MG PO TABS
5.0000 mg | ORAL_TABLET | ORAL | Status: DC | PRN
  Administered 2019-01-06: 5 mg via ORAL
  Administered 2019-01-07 (×3): 10 mg via ORAL
  Filled 2019-01-05 (×5): qty 2

## 2019-01-05 MED ORDER — TAMSULOSIN HCL 0.4 MG PO CAPS: 0.4000 mg | ORAL_CAPSULE | Freq: Every day | ORAL | Status: DC

## 2019-01-05 MED ORDER — METHOCARBAMOL 1000 MG/10ML IJ SOLN
500.0000 mg | Freq: Four times a day (QID) | INTRAVENOUS | Status: DC | PRN
  Filled 2019-01-05 (×2): qty 5

## 2019-01-05 MED ORDER — OXYCODONE HCL 5 MG PO TABS
10.0000 mg | ORAL_TABLET | ORAL | Status: DC | PRN
  Administered 2019-01-05 – 2019-01-06 (×4): 15 mg via ORAL
  Administered 2019-01-08: 10 mg via ORAL
  Filled 2019-01-05 (×4): qty 3

## 2019-01-05 NOTE — Progress Notes (Signed)
Ortho Trauma Note  Discussed case with Dr. Veverly Fells. 53 yo male with bicondylar tibial plateau fracture with significant lateral condyle involvement. Will need formal ORIF. Tentatively plan for ORIF tomorrow vs Tuesday pending swelling and OR availability. If patient is severely swollen may need external fixation tomorrow. NPO after midnight. Formal ortho trauma consult in the AM.  Shona Needles, MD [facility] [phone] (phone) [phone] (office) [email or web address]

## 2019-01-05 NOTE — Progress Notes (Signed)
Subjective: Patient reports pain in the left leg. Denies numbness and tingling in the leg. Denies SOB and chest pain. Denies LOC.   Objective: Vital signs in last 24 hours: Temp:  [97.6 F (36.4 C)-97.7 F (36.5 C)] 97.7 F (36.5 C) (06/07 0332) Pulse Rate:  [96-112] 103 (06/07 0332) Resp:  [13-26] 16 (06/07 0332) BP: (97-125)/(65-87) 125/81 (06/07 0332) SpO2:  [91 %-97 %] 95 % (06/07 0332) Weight:  [102.1 kg] 102.1 kg (06/06 1943)  Intake/Output from previous day: 06/06 0701 - 06/07 0700 In: 645.9 [I.V.:145.9; IV Piggyback:500] Out: -  Intake/Output this shift: No intake/output data recorded.  Recent Labs    01/04/19 2027  HGB 14.9   Recent Labs    01/04/19 2027  WBC 13.2*  RBC 4.82  HCT 44.8  PLT 267   Recent Labs    01/04/19 2027  NA 136  K 4.1  CL 104  CO2 24  BUN 14  CREATININE 1.56*  GLUCOSE 122*  CALCIUM 9.2   Recent Labs    01/04/19 2027  INR 1.2    Neurovascular intact Sensation intact distally Intact pulses distally Dorsiflexion/Plantar flexion intact Compartment soft    Assessment/Plan: Comminuted, displaced left bicondylar tibial plateau fracture Remain on strict bed rest. NWB Continue to monitor for compartment syndrome Awaiting ortho trauma consultation for surgery   Sherril Shipman L Yazaira Speas 01/05/2019, 9:15 AM

## 2019-01-05 NOTE — H&P (Signed)
Corey Landry is an 53 y.o. male.   Chief Complaint: Left knee pain HPI: 53 yo male who presents after falling off of a ladder 10 feet onto his left leg. Patient complained off immediate left knee deformity and pain. Patient unable to stand after the fall and EMS was called. He denies LOC or neck or back pain.   Past Medical History:  Diagnosis Date  . Hypertension   . Renal disorder    kidney stones    History reviewed. No pertinent surgical history.  No family history on file. Social History:  reports that he has been smoking cigarettes. He has a 15.00 pack-year smoking history. He has never used smokeless tobacco. He reports previous alcohol use. He reports that he does not use drugs.  Allergies:  Allergies  Allergen Reactions  . Penicillins     (Not in a hospital admission)   Results for orders placed or performed during the hospital encounter of 01/04/19 (from the past 48 hour(s))  CBC with Differential     Status: Abnormal   Collection Time: 01/04/19  8:27 PM  Result Value Ref Range   WBC 13.2 (H) 4.0 - 10.5 K/uL   RBC 4.82 4.22 - 5.81 MIL/uL   Hemoglobin 14.9 13.0 - 17.0 g/dL   HCT 96.044.8 45.439.0 - 09.852.0 %   MCV 92.9 80.0 - 100.0 fL   MCH 30.9 26.0 - 34.0 pg   MCHC 33.3 30.0 - 36.0 g/dL   RDW 11.912.4 14.711.5 - 82.915.5 %   Platelets 267 150 - 400 K/uL   nRBC 0.0 0.0 - 0.2 %   Neutrophils Relative % 79 %   Neutro Abs 10.4 (H) 1.7 - 7.7 K/uL   Lymphocytes Relative 13 %   Lymphs Abs 1.7 0.7 - 4.0 K/uL   Monocytes Relative 7 %   Monocytes Absolute 0.9 0.1 - 1.0 K/uL   Eosinophils Relative 0 %   Eosinophils Absolute 0.0 0.0 - 0.5 K/uL   Basophils Relative 0 %   Basophils Absolute 0.1 0.0 - 0.1 K/uL   Immature Granulocytes 1 %   Abs Immature Granulocytes 0.07 0.00 - 0.07 K/uL    Comment: Performed at [facility], [address] [place], [place] [address]  Basic metabolic panel     Status: Abnormal   Collection Time: 01/04/19  8:27 PM  Result Value Ref Range   Sodium 136  135 - 145 mmol/L   Potassium 4.1 3.5 - 5.1 mmol/L   Chloride 104 98 - 111 mmol/L   CO2 24 22 - 32 mmol/L   Glucose, Bld 122 (H) 70 - 99 mg/dL   BUN 14 6 - 20 mg/dL   Creatinine, Ser 0.861.56 (H) 0.61 - 1.24 mg/dL   Calcium 9.2 8.9 - 57.810.3 mg/dL   GFR calc non Af Amer 51 (L) >60 mL/min   GFR calc Af Amer 59 (L) >60 mL/min   Anion gap 8 5 - 15    Comment: Performed at [facility], [address] [place], [place] [address]  Protime-INR     Status: None   Collection Time: 01/04/19  8:27 PM  Result Value Ref Range   Prothrombin Time 14.9 11.4 - 15.2 seconds   INR 1.2 0.8 - 1.2    Comment: (NOTE) INR goal varies based on device and disease states. Performed at [facility], [address] [place], [place] [address]    Dg Knee 2 Views Left  Result Date: 01/04/2019 CLINICAL DATA:  Lateral fall from 10-12 feet landing on left side. EXAM: LEFT KNEE - 1-2 VIEW COMPARISON:  None. FINDINGS: Exam demonstrates a displaced comminuted fracture of the proximal tibia predominately involving the lateral plateau and extending into the metaphysis. There is depression of the lateral plateau. Remainder the exam is unremarkable. IMPRESSION: Displaced comminuted fracture of the proximal tibia with involvement of the lateral plateau extending into the metaphysis. Electronically Signed   By: Elberta Fortisaniel  Boyle M.D.   On: 01/04/2019 21:49   Dg Tibia/fibula Left  Result Date: 01/04/2019 CLINICAL DATA:  Lateral fall from 10-12 feet landing on left side. EXAM: LEFT TIBIA AND FIBULA - 2 VIEW COMPARISON:  None. FINDINGS: There is a displaced comminuted fracture of the proximal tibia involving predominately the lateral plateau and extending into the metaphyseal region. Remainder of the lower leg is within normal. IMPRESSION: Displaced comminuted fracture of the proximal tibia with involvement of the lateral plateau extending into the metaphysis. Electronically Signed   By: Elberta Fortisaniel  Boyle M.D.   On: 01/04/2019 21:48    Dg Ankle 2 Views Left  Result Date: 01/04/2019 CLINICAL DATA:  Lateral fall from 10-12 feet landing on left side. EXAM: LEFT ANKLE - 2 VIEW COMPARISON:  None. FINDINGS: Ankle mortise is normal. No evidence of acute fracture or dislocation. IMPRESSION: No acute findings. Electronically Signed   By: Elberta Fortisaniel  Boyle M.D.   On: 01/04/2019 21:50   Ct Knee Left Wo Contrast  Result Date: 01/05/2019 CLINICAL DATA:  53 year old male with trauma to the left knee. EXAM: CT OF THE left KNEE WITHOUT CONTRAST TECHNIQUE: Multidetector CT imaging of the left knee was performed according to the standard protocol. Multiplanar CT image reconstructions were also generated. COMPARISON:  Left knee radiograph dated 01/04/2019 FINDINGS: Bones/Joint/Cartilage There is a comminuted displaced and intra-articular fracture of the proximal tibia with depressed and fragmented fracture of the lateral tibial plateau. Oblique fracture lines extend to the proximal tibial diaphysis. The visualized portion of the femur and fibula appear intact. There is no dislocation. There is a large lipohemarthrosis. Several small pockets of gas noted in the superficial soft tissue and fascial of the anterolateral aspect of the knee. Ligaments Suboptimally assessed by CT. Probable injury of the cruciate ligament. Muscles and Tendons No large hematoma. Soft tissues Subcutaneous edema of the knee. IMPRESSION: 1. Comminuted and displaced intra-articular fracture of the proximal tibia. 2. Large suprapatellar lipohemarthrosis. Electronically Signed   By: Elgie CollardArash  Radparvar M.D.   On: 01/05/2019 00:25   Dg Chest Portable 1 View  Result Date: 01/04/2019 CLINICAL DATA:  Lateral fall from 10-12 feet landing on left side. EXAM: PORTABLE CHEST 1 VIEW COMPARISON:  None. FINDINGS: Lungs are adequately inflated and otherwise clear. Cardiomediastinal silhouette is normal. No evidence of fracture. IMPRESSION: No acute findings. Electronically Signed   By: Elberta Fortisaniel  Boyle M.D.    On: 01/04/2019 21:50   Dg Hip Unilat W Or Wo Pelvis 2-3 Views Left  Result Date: 01/04/2019 CLINICAL DATA:  Lateral fall from 10-12 feet today landing on left side. Left knee deformity. EXAM: DG HIP (WITH OR WITHOUT PELVIS) 2-3V LEFT COMPARISON:  None. FINDINGS: There is no evidence of hip fracture or dislocation. There is no evidence of arthropathy or other focal bone abnormality. IMPRESSION: Negative. Electronically Signed   By: Elberta Fortisaniel  Boyle M.D.   On: 01/04/2019 21:46    ROS  Blood pressure 112/73, pulse (!) 105, temperature 97.6 F (36.4 C), temperature source Oral, resp. rate 18, height 5\' 9"  (1.753 m), weight 102.1  kg, SpO2 94 %. Physical Exam  AAO, moderate distress, neck is non tender with pain free AROM, T and L spine nontender Chest with normal excursion and no tenderness to lateral compression, heart regular Abdomen soft Bilateral UEs with pain free AROM and normal strength, sensation intact Right LE with no deformity and no tenderness, normal AROM, NVI distally Left knee swollen and tender, compartments are full but not tense. No pain with AROM of the toes, but guarding when asking patient to move the ankle due to knee pain. Unable to assess hip ROM on the left  Assessment/Plan Comminuted and displaced left bicondylar tibial plateau fracture Admit for pain control, and definitive surgical management. Will observe closely for compartment syndrome DVT prophylaxis Bed rest Will discuss with ortho trauma - [facility] or [facility] to see what their availability is for repair   Augustin Schooling, MD 01/05/2019, 12:28 AM

## 2019-01-05 NOTE — Progress Notes (Signed)
Patient's mother, Dyane Dustman, called to check on patient's plan of care.   RN informed her to call son for additional information abiding by [place] regulations Mother was polite and agreed.

## 2019-01-05 NOTE — ED Provider Notes (Signed)
12:23 AM Assumed care from Dr. Reather Converse and Myriam Jacobson, please see their note for full history, physical and decision making until this point. In brief this is a 53 y.o. year old male who presented to the ED tonight with Fall     Tibial plateau fracture, pending ortho consult for likely admission.   Ortho saw. Pending CT and covid for admission for observation. Immobilizer placed.   Labs, studies and imaging reviewed by myself and considered in medical decision making if ordered. Imaging interpreted by radiology.  Labs Reviewed  CBC WITH DIFFERENTIAL/PLATELET - Abnormal; Notable for the following components:      Result Value   WBC 13.2 (*)    Neutro Abs 10.4 (*)    All other components within normal limits  BASIC METABOLIC PANEL - Abnormal; Notable for the following components:   Glucose, Bld 122 (*)    Creatinine, Ser 1.56 (*)    GFR calc non Af Amer 51 (*)    GFR calc Af Amer 59 (*)    All other components within normal limits  SARS CORONAVIRUS 2 (HOSPITAL ORDER, [facility])  PROTIME-INR    DG Hip Unilat W or Wo Pelvis 2-3 Views Left  Final Result    DG Knee 2 Views Left  Final Result    DG Ankle 2 Views Left  Final Result    DG Tibia/Fibula Left  Final Result    DG Chest Portable 1 View  Final Result    CT Knee Left Wo Contrast    (Results Pending)    No follow-ups on file.    Alianna Wurster, Corene Cornea, MD 01/05/19 [id number]

## 2019-01-05 NOTE — ED Notes (Signed)
ED TO INPATIENT HANDOFF REPORT  ED Nurse Name and Phone #: Sharrie Rothman [phone]  S Name/Age/Gender Corey Landry 53 y.o. male Room/Bed: 022C/022C  Code Status   Code Status: Not on file  Home/SNF/Other Home Patient oriented to: self, place, time and situation Is this baseline? Yes   Triage Complete: Triage complete  Chief Complaint Fall off ladder  Triage Note Pt bib [facility] after fall from ladder at approximately 10-12 feet.  Pt landed on left side.  Denies hitting head or LOC.  Pt has obvious deformity to left knee.  Pt given total of 200 mcg of fentanyl en route.  Pt is A&O x 4.      Allergies Allergies  Allergen Reactions  . Penicillins     Level of Care/Admitting Diagnosis ED Disposition    ED Disposition Condition Comment   Admit  The patient appears reasonably stabilized for admission considering the current resources, flow, and capabilities available in the ED at this time, and I doubt any other [facility] requiring further screening and/or treatment in the ED prior to admission is  present.       B Medical/Surgery History Past Medical History:  Diagnosis Date  . Hypertension   . Renal disorder    kidney stones   History reviewed. No pertinent surgical history.   A IV Location/Drains/Wounds Patient Lines/Drains/Airways Status   Active Line/Drains/Airways    Name:   Placement date:   Placement time:   Site:   Days:   Peripheral IV 01/04/19 Left Forearm   01/04/19    1937    Forearm   1          Intake/Output Last 24 hours  Intake/Output Summary (Last 24 hours) at 01/05/2019 0118 Last data filed at 01/04/2019 2317 Gross per 24 hour  Intake 500 ml  Output -  Net 500 ml    Labs/Imaging Results for orders placed or performed during the hospital encounter of 01/04/19 (from the past 48 hour(s))  CBC with Differential     Status: Abnormal   Collection Time: 01/04/19  8:27 PM  Result Value Ref Range   WBC 13.2 (H) 4.0 - 10.5 K/uL   RBC 4.82 4.22 - 5.81 MIL/uL    Hemoglobin 14.9 13.0 - 17.0 g/dL   HCT 44.8 39.0 - 52.0 %   MCV 92.9 80.0 - 100.0 fL   MCH 30.9 26.0 - 34.0 pg   MCHC 33.3 30.0 - 36.0 g/dL   RDW 12.4 11.5 - 15.5 %   Platelets 267 150 - 400 K/uL   nRBC 0.0 0.0 - 0.2 %   Neutrophils Relative % 79 %   Neutro Abs 10.4 (H) 1.7 - 7.7 K/uL   Lymphocytes Relative 13 %   Lymphs Abs 1.7 0.7 - 4.0 K/uL   Monocytes Relative 7 %   Monocytes Absolute 0.9 0.1 - 1.0 K/uL   Eosinophils Relative 0 %   Eosinophils Absolute 0.0 0.0 - 0.5 K/uL   Basophils Relative 0 %   Basophils Absolute 0.1 0.0 - 0.1 K/uL   Immature Granulocytes 1 %   Abs Immature Granulocytes 0.07 0.00 - 0.07 K/uL    Comment: Performed at [facility], [address], [place],  [address]  Basic metabolic panel     Status: Abnormal   Collection Time: 01/04/19  8:27 PM  Result Value Ref Range   Sodium 136 135 - 145 mmol/L   Potassium 4.1 3.5 - 5.1 mmol/L   Chloride 104 98 - 111 mmol/L  CO2 24 22 - 32 mmol/L   Glucose, Bld 122 (H) 70 - 99 mg/dL   BUN 14 6 - 20 mg/dL   Creatinine, Ser 1.611.56 (H) 0.61 - 1.24 mg/dL   Calcium 9.2 8.9 - 09.610.3 mg/dL   GFR calc non Af Amer 51 (L) >60 mL/min   GFR calc Af Amer 59 (L) >60 mL/min   Anion gap 8 5 - 15    Comment: Performed at [facility], [address] [place], [place] [address]  Protime-INR     Status: None   Collection Time: 01/04/19  8:27 PM  Result Value Ref Range   Prothrombin Time 14.9 11.4 - 15.2 seconds   INR 1.2 0.8 - 1.2    Comment: (NOTE) INR goal varies based on device and disease states. Performed at [facility], [address] [place], [place] [address]    Dg Knee 2 Views Left  Result Date: 01/04/2019 CLINICAL DATA:  Lateral fall from 10-12 feet landing on left side. EXAM: LEFT KNEE - 1-2 VIEW COMPARISON:  None. FINDINGS: Exam demonstrates a displaced comminuted fracture of the proximal tibia predominately involving the lateral plateau and extending into the metaphysis. There is depression  of the lateral plateau. Remainder the exam is unremarkable. IMPRESSION: Displaced comminuted fracture of the proximal tibia with involvement of the lateral plateau extending into the metaphysis. Electronically Signed   By: Elberta Fortisaniel  Boyle M.D.   On: 01/04/2019 21:49   Dg Tibia/fibula Left  Result Date: 01/04/2019 CLINICAL DATA:  Lateral fall from 10-12 feet landing on left side. EXAM: LEFT TIBIA AND FIBULA - 2 VIEW COMPARISON:  None. FINDINGS: There is a displaced comminuted fracture of the proximal tibia involving predominately the lateral plateau and extending into the metaphyseal region. Remainder of the lower leg is within normal. IMPRESSION: Displaced comminuted fracture of the proximal tibia with involvement of the lateral plateau extending into the metaphysis. Electronically Signed   By: Elberta Fortisaniel  Boyle M.D.   On: 01/04/2019 21:48   Dg Ankle 2 Views Left  Result Date: 01/04/2019 CLINICAL DATA:  Lateral fall from 10-12 feet landing on left side. EXAM: LEFT ANKLE - 2 VIEW COMPARISON:  None. FINDINGS: Ankle mortise is normal. No evidence of acute fracture or dislocation. IMPRESSION: No acute findings. Electronically Signed   By: Elberta Fortisaniel  Boyle M.D.   On: 01/04/2019 21:50   Ct Knee Left Wo Contrast  Result Date: 01/05/2019 CLINICAL DATA:  53 year old male with trauma to the left knee. EXAM: CT OF THE left KNEE WITHOUT CONTRAST TECHNIQUE: Multidetector CT imaging of the left knee was performed according to the standard protocol. Multiplanar CT image reconstructions were also generated. COMPARISON:  Left knee radiograph dated 01/04/2019 FINDINGS: Bones/Joint/Cartilage There is a comminuted displaced and intra-articular fracture of the proximal tibia with depressed and fragmented fracture of the lateral tibial plateau. Oblique fracture lines extend to the proximal tibial diaphysis. The visualized portion of the femur and fibula appear intact. There is no dislocation. There is a large lipohemarthrosis. Several  small pockets of gas noted in the superficial soft tissue and fascial of the anterolateral aspect of the knee. Ligaments Suboptimally assessed by CT. Probable injury of the cruciate ligament. Muscles and Tendons No large hematoma. Soft tissues Subcutaneous edema of the knee. IMPRESSION: 1. Comminuted and displaced intra-articular fracture of the proximal tibia. 2. Large suprapatellar lipohemarthrosis. Electronically Signed   By: Elgie CollardArash  Radparvar M.D.   On: 01/05/2019 00:25   Dg Chest Portable 1 View  Result Date: 01/04/2019 CLINICAL DATA:  Lateral fall from 10-12 feet landing on left side. EXAM: PORTABLE CHEST 1 VIEW COMPARISON:  None. FINDINGS: Lungs are adequately inflated and otherwise clear. Cardiomediastinal silhouette is normal. No evidence of fracture. IMPRESSION: No acute findings. Electronically Signed   By: Elberta Fortisaniel  Boyle M.D.   On: 01/04/2019 21:50   Dg Hip Unilat W Or Wo Pelvis 2-3 Views Left  Result Date: 01/04/2019 CLINICAL DATA:  Lateral fall from 10-12 feet today landing on left side. Left knee deformity. EXAM: DG HIP (WITH OR WITHOUT PELVIS) 2-3V LEFT COMPARISON:  None. FINDINGS: There is no evidence of hip fracture or dislocation. There is no evidence of arthropathy or other focal bone abnormality. IMPRESSION: Negative. Electronically Signed   By: Elberta Fortisaniel  Boyle M.D.   On: 01/04/2019 21:46    Pending Labs Unresulted Labs (From admission, onward)    Start     Ordered   01/04/19 2046  SARS Coronavirus 2 (CEPHEID - Performed in [facility] hospital lab), Hosp Order  (Asymptomatic Patients Labs)  Once,   R    Question:  Rule Out  Answer:  Yes   01/04/19 2045          Vitals/Pain Today's Vitals   01/04/19 2245 01/04/19 2300 01/04/19 2315 01/04/19 2325  BP: 106/69 101/72 112/73   Pulse: (!) 106 (!) 106 (!) 105   Resp: 20 19 18    Temp:      TempSrc:      SpO2: 97% 96% 94%   Weight:      Height:      PainSc:    8     Isolation Precautions No active  isolations  Medications Medications  fentaNYL (SUBLIMAZE) injection 50 mcg (has no administration in time range)  HYDROmorphone (DILAUDID) injection 1 mg (1 mg Intravenous Given 01/04/19 2110)  ondansetron (ZOFRAN) injection 4 mg (4 mg Intravenous Given 01/04/19 2110)  sodium chloride 0.9 % bolus 500 mL (0 mLs Intravenous Stopped 01/04/19 2317)  HYDROmorphone (DILAUDID) injection 1 mg (1 mg Intravenous Given 01/04/19 2326)    Mobility walks Low fall risk   Focused Assessments Left knee/tibia fx   R Recommendations: See Admitting Provider Note  Report given to:   Additional Notes: Pt is A&O x4.  Fell from a ladder while cutting a tree. Has had 1 mg dilaudid x 2, 200 mcg of fentanyl by EMS. Knee immobilizer in place.

## 2019-01-05 NOTE — Progress Notes (Signed)
Orthopedic Tech Progress Note Patient Details:  Corey Landry 12/13/1965 [id number]  Ortho Devices Type of Ortho Device: Knee Immobilizer Ortho Device/Splint Location: lle. applied as requested by dr, with a folded towel under the knee to keep the knee bent. Ortho Device/Splint Interventions: Ordered, Application, Adjustment   Post Interventions Patient Tolerated: Well Instructions Provided: Care of device, Adjustment of device   Karolee Stamps 01/05/2019, 1:27 AM

## 2019-01-06 ENCOUNTER — Inpatient Hospital Stay (HOSPITAL_COMMUNITY): Payer: Managed Care, Other (non HMO) | Admitting: Certified Registered Nurse Anesthetist

## 2019-01-06 ENCOUNTER — Inpatient Hospital Stay (HOSPITAL_COMMUNITY): Payer: Managed Care, Other (non HMO)

## 2019-01-06 ENCOUNTER — Encounter (HOSPITAL_COMMUNITY)
Admission: EM | Disposition: A | Discharge status: Home / Self Care | Payer: Self-pay | Source: Home / Self Care | Attending: Student

## 2019-01-06 ENCOUNTER — Encounter (HOSPITAL_COMMUNITY): Payer: Self-pay | Admitting: Certified Registered Nurse Anesthetist

## 2019-01-06 HISTORY — PX: ORIF TIBIA PLATEAU: SHX2132

## 2019-01-06 LAB — MRSA PCR SCREENING: MRSA by PCR: NEGATIVE

## 2019-01-06 SURGERY — OPEN REDUCTION INTERNAL FIXATION (ORIF) TIBIAL PLATEAU
Anesthesia: General | Laterality: Left

## 2019-01-06 MED ORDER — CEFAZOLIN SODIUM-DEXTROSE 2-4 GM/100ML-% IV SOLN
2.0000 g | Freq: Once | INTRAVENOUS | Status: AC
  Administered 2019-01-06: 2 g via INTRAVENOUS

## 2019-01-06 MED ORDER — EPHEDRINE SULFATE-NACL 50-0.9 MG/10ML-% IV SOSY: PREFILLED_SYRINGE | INTRAVENOUS | Status: DC | PRN

## 2019-01-06 MED ORDER — OXYCODONE HCL 5 MG PO TABS
ORAL_TABLET | ORAL | Status: AC
  Filled 2019-01-06: qty 1

## 2019-01-06 MED ORDER — HYDROMORPHONE HCL 1 MG/ML IJ SOLN
0.2500 mg | INTRAMUSCULAR | Status: DC | PRN
  Administered 2019-01-06: 0.25 mg via INTRAVENOUS
  Administered 2019-01-06: 0.5 mg via INTRAVENOUS
  Administered 2019-01-06: 0.25 mg via INTRAVENOUS

## 2019-01-06 MED ORDER — LABETALOL HCL 5 MG/ML IV SOLN
5.0000 mg | INTRAVENOUS | Status: DC | PRN
  Administered 2019-01-06 (×2): 5 mg via INTRAVENOUS

## 2019-01-06 MED ORDER — MIDAZOLAM HCL 2 MG/2ML IJ SOLN
INTRAMUSCULAR | Status: DC | PRN
  Administered 2019-01-06: 2 mg via INTRAVENOUS

## 2019-01-06 MED ORDER — VANCOMYCIN HCL 1000 MG IV SOLR
INTRAVENOUS | Status: AC
  Filled 2019-01-06: qty 1000

## 2019-01-06 MED ORDER — VITAMIN D 25 MCG (1000 UNIT) PO TABS
4000.0000 [IU] | ORAL_TABLET | Freq: Every day | ORAL | Status: DC
  Administered 2019-01-07 – 2019-01-08 (×2): 4000 [IU] via ORAL
  Filled 2019-01-06 (×2): qty 4

## 2019-01-06 MED ORDER — FENTANYL CITRATE (PF) 250 MCG/5ML IJ SOLN
INTRAMUSCULAR | Status: AC
  Filled 2019-01-06: qty 5

## 2019-01-06 MED ORDER — HYDROMORPHONE HCL 1 MG/ML IJ SOLN
1.0000 mg | INTRAMUSCULAR | Status: DC | PRN
  Administered 2019-01-08: 1 mg via INTRAVENOUS
  Filled 2019-01-06: qty 1

## 2019-01-06 MED ORDER — ALBUTEROL SULFATE HFA 108 (90 BASE) MCG/ACT IN AERS
INHALATION_SPRAY | RESPIRATORY_TRACT | Status: DC | PRN
  Administered 2019-01-06: 6 via RESPIRATORY_TRACT

## 2019-01-06 MED ORDER — ALBUTEROL SULFATE HFA 108 (90 BASE) MCG/ACT IN AERS
INHALATION_SPRAY | RESPIRATORY_TRACT | Status: AC
  Filled 2019-01-06: qty 6.7

## 2019-01-06 MED ORDER — FENTANYL CITRATE (PF) 100 MCG/2ML IJ SOLN
100.0000 ug | Freq: Once | INTRAMUSCULAR | Status: AC
  Administered 2019-01-06: 100 ug via INTRAVENOUS

## 2019-01-06 MED ORDER — HYDROMORPHONE HCL 1 MG/ML IJ SOLN
INTRAMUSCULAR | Status: AC
  Filled 2019-01-06: qty 1

## 2019-01-06 MED ORDER — ACETAMINOPHEN 500 MG PO TABS
ORAL_TABLET | ORAL | Status: AC
  Filled 2019-01-06: qty 2

## 2019-01-06 MED ORDER — ACETAMINOPHEN 325 MG PO TABS
650.0000 mg | ORAL_TABLET | Freq: Four times a day (QID) | ORAL | Status: DC
  Administered 2019-01-06 – 2019-01-08 (×7): 650 mg via ORAL
  Filled 2019-01-06 (×7): qty 2

## 2019-01-06 MED ORDER — TOBRAMYCIN POWD: Status: DC | PRN

## 2019-01-06 MED ORDER — PHENYLEPHRINE 40 MCG/ML (10ML) SYRINGE FOR IV PUSH (FOR BLOOD PRESSURE SUPPORT)
PREFILLED_SYRINGE | INTRAVENOUS | Status: DC | PRN
  Administered 2019-01-06: 120 ug via INTRAVENOUS

## 2019-01-06 MED ORDER — ENOXAPARIN SODIUM 40 MG/0.4ML ~~LOC~~ SOLN
40.0000 mg | SUBCUTANEOUS | Status: DC
  Administered 2019-01-07 – 2019-01-08 (×2): 40 mg via SUBCUTANEOUS
  Filled 2019-01-06 (×2): qty 0.4

## 2019-01-06 MED ORDER — GABAPENTIN 100 MG PO CAPS
100.0000 mg | ORAL_CAPSULE | Freq: Three times a day (TID) | ORAL | Status: DC
  Administered 2019-01-06 – 2019-01-08 (×5): 100 mg via ORAL
  Filled 2019-01-06 (×5): qty 1

## 2019-01-06 MED ORDER — CEFAZOLIN SODIUM-DEXTROSE 2-4 GM/100ML-% IV SOLN
INTRAVENOUS | Status: AC
  Filled 2019-01-06: qty 100

## 2019-01-06 MED ORDER — PROPOFOL 10 MG/ML IV BOLUS
INTRAVENOUS | Status: DC | PRN
  Administered 2019-01-06: 200 mg via INTRAVENOUS

## 2019-01-06 MED ORDER — LACTATED RINGERS IV SOLN
INTRAVENOUS | Status: DC
  Administered 2019-01-06 (×2): via INTRAVENOUS

## 2019-01-06 MED ORDER — FENTANYL CITRATE (PF) 250 MCG/5ML IJ SOLN
INTRAMUSCULAR | Status: DC | PRN
  Administered 2019-01-06 (×3): 50 ug via INTRAVENOUS
  Administered 2019-01-06: 150 ug via INTRAVENOUS
  Administered 2019-01-06: 25 ug via INTRAVENOUS
  Administered 2019-01-06 (×2): 50 ug via INTRAVENOUS

## 2019-01-06 MED ORDER — BACITRACIN 500 UNIT/GM EX OINT
TOPICAL_OINTMENT | CUTANEOUS | Status: DC | PRN
  Administered 2019-01-06: 1 via TOPICAL

## 2019-01-06 MED ORDER — ALBUMIN HUMAN 5 % IV SOLN
INTRAVENOUS | Status: DC | PRN
  Administered 2019-01-06: 16:00:00 via INTRAVENOUS

## 2019-01-06 MED ORDER — ACETAMINOPHEN 500 MG PO TABS
1000.0000 mg | ORAL_TABLET | Freq: Once | ORAL | Status: AC
  Administered 2019-01-06: 1000 mg via ORAL

## 2019-01-06 MED ORDER — TOBRAMYCIN SULFATE 1.2 G IJ SOLR
INTRAMUSCULAR | Status: AC
  Filled 2019-01-06: qty 1.2

## 2019-01-06 MED ORDER — DEXAMETHASONE SODIUM PHOSPHATE 10 MG/ML IJ SOLN
INTRAMUSCULAR | Status: DC | PRN
  Administered 2019-01-06: 5 mg via INTRAVENOUS

## 2019-01-06 MED ORDER — BACITRACIN ZINC 500 UNIT/GM EX OINT
TOPICAL_OINTMENT | CUTANEOUS | Status: AC
  Filled 2019-01-06: qty 28.35

## 2019-01-06 MED ORDER — CEFAZOLIN SODIUM-DEXTROSE 2-4 GM/100ML-% IV SOLN
2.0000 g | Freq: Three times a day (TID) | INTRAVENOUS | Status: AC
  Administered 2019-01-06 – 2019-01-07 (×3): 2 g via INTRAVENOUS
  Filled 2019-01-06 (×4): qty 100

## 2019-01-06 MED ORDER — PROPOFOL 10 MG/ML IV BOLUS
INTRAVENOUS | Status: AC
  Filled 2019-01-06: qty 20

## 2019-01-06 MED ORDER — MIDAZOLAM HCL 2 MG/2ML IJ SOLN
INTRAMUSCULAR | Status: AC
  Filled 2019-01-06: qty 2

## 2019-01-06 MED ORDER — 0.9 % SODIUM CHLORIDE (POUR BTL) OPTIME
TOPICAL | Status: DC | PRN
  Administered 2019-01-06: 16:00:00 1000 mL

## 2019-01-06 MED ORDER — LABETALOL HCL 5 MG/ML IV SOLN
INTRAVENOUS | Status: AC
  Filled 2019-01-06: qty 4

## 2019-01-06 MED ORDER — VANCOMYCIN HCL 1000 MG IV SOLR
INTRAVENOUS | Status: DC | PRN
  Administered 2019-01-06: 1000 mg via TOPICAL

## 2019-01-06 MED ORDER — ROCURONIUM BROMIDE 10 MG/ML (PF) SYRINGE
PREFILLED_SYRINGE | INTRAVENOUS | Status: DC | PRN
  Administered 2019-01-06: 40 mg via INTRAVENOUS
  Administered 2019-01-06: 60 mg via INTRAVENOUS

## 2019-01-06 MED ORDER — FENTANYL CITRATE (PF) 100 MCG/2ML IJ SOLN
INTRAMUSCULAR | Status: AC
  Filled 2019-01-06: qty 2

## 2019-01-06 MED ORDER — SUCCINYLCHOLINE CHLORIDE 200 MG/10ML IV SOSY
PREFILLED_SYRINGE | INTRAVENOUS | Status: DC | PRN
  Administered 2019-01-06: 100 mg via INTRAVENOUS

## 2019-01-06 MED ORDER — LIDOCAINE 2% (20 MG/ML) 5 ML SYRINGE
INTRAMUSCULAR | Status: DC | PRN
  Administered 2019-01-06: 60 mg via INTRAVENOUS

## 2019-01-06 MED ORDER — SUGAMMADEX SODIUM 200 MG/2ML IV SOLN
INTRAVENOUS | Status: DC | PRN
  Administered 2019-01-06: 200 mg via INTRAVENOUS

## 2019-01-06 SURGICAL SUPPLY — 84 items
BANDAGE ACE 4X5 VEL STRL LF (GAUZE/BANDAGES/DRESSINGS) ×3 IMPLANT
BANDAGE ACE 6X5 VEL STRL LF (GAUZE/BANDAGES/DRESSINGS) ×3 IMPLANT
BANDAGE ELASTIC 4 VELCRO ST LF (GAUZE/BANDAGES/DRESSINGS) ×3 IMPLANT
BANDAGE ELASTIC 6 VELCRO ST LF (GAUZE/BANDAGES/DRESSINGS) ×3 IMPLANT
BANDAGE ESMARK 6X9 LF (GAUZE/BANDAGES/DRESSINGS) ×1 IMPLANT
BIT DRILL 2.5 X LONG (BIT) ×1
BIT DRILL CALIBR QC 2.8X250 (BIT) ×3 IMPLANT
BIT DRILL QC 3.5X110 (BIT) ×3 IMPLANT
BIT DRILL X LONG 2.5 (BIT) ×1 IMPLANT
BLADE CLIPPER SURG (BLADE) IMPLANT
BLADE SURG 15 STRL LF DISP TIS (BLADE) ×1 IMPLANT
BLADE SURG 15 STRL SS (BLADE) ×2
BNDG ESMARK 6X9 LF (GAUZE/BANDAGES/DRESSINGS) ×3
BNDG GAUZE ELAST 4 BULKY (GAUZE/BANDAGES/DRESSINGS) ×3 IMPLANT
BRUSH SCRUB SURG 4.25 DISP (MISCELLANEOUS) ×6 IMPLANT
CANISTER SUCT 3000ML PPV (MISCELLANEOUS) ×3 IMPLANT
CHLORAPREP W/TINT 26ML (MISCELLANEOUS) ×6 IMPLANT
COVER SURGICAL LIGHT HANDLE (MISCELLANEOUS) ×3 IMPLANT
COVER WAND RF STERILE (DRAPES) ×3 IMPLANT
CUFF TOURNIQUET SINGLE 34IN LL (TOURNIQUET CUFF) ×3 IMPLANT
DRAPE C-ARM 42X72 X-RAY (DRAPES) ×3 IMPLANT
DRAPE C-ARMOR (DRAPES) ×3 IMPLANT
DRAPE ORTHO SPLIT 77X108 STRL (DRAPES) ×4
DRAPE SURG ORHT 6 SPLT 77X108 (DRAPES) ×2 IMPLANT
DRAPE U-SHAPE 47X51 STRL (DRAPES) ×3 IMPLANT
DRILL BIT X LONG 2.5 (BIT) ×2
DRSG ADAPTIC 3X8 NADH LF (GAUZE/BANDAGES/DRESSINGS) ×3 IMPLANT
DRSG PAD ABDOMINAL 8X10 ST (GAUZE/BANDAGES/DRESSINGS) ×6 IMPLANT
ELECT REM PT RETURN 9FT ADLT (ELECTROSURGICAL) ×3
ELECTRODE REM PT RTRN 9FT ADLT (ELECTROSURGICAL) ×1 IMPLANT
GAUZE SPONGE 4X4 12PLY STRL (GAUZE/BANDAGES/DRESSINGS) ×6 IMPLANT
GLOVE BIO SURGEON STRL SZ 6.5 (GLOVE) ×6 IMPLANT
GLOVE BIO SURGEON STRL SZ7.5 (GLOVE) ×12 IMPLANT
GLOVE BIO SURGEONS STRL SZ 6.5 (GLOVE) ×3
GLOVE BIOGEL PI IND STRL 6.5 (GLOVE) ×1 IMPLANT
GLOVE BIOGEL PI IND STRL 7.5 (GLOVE) ×1 IMPLANT
GLOVE BIOGEL PI INDICATOR 6.5 (GLOVE) ×2
GLOVE BIOGEL PI INDICATOR 7.5 (GLOVE) ×2
GOWN STRL REUS W/ TWL LRG LVL3 (GOWN DISPOSABLE) ×2 IMPLANT
GOWN STRL REUS W/TWL LRG LVL3 (GOWN DISPOSABLE) ×4
IMMOBILIZER KNEE 22 UNIV (SOFTGOODS) ×3 IMPLANT
KIT BASIN OR (CUSTOM PROCEDURE TRAY) ×3 IMPLANT
KIT TURNOVER KIT B (KITS) ×3 IMPLANT
NDL SUT 6 .5 CRC .975X.05 MAYO (NEEDLE) ×1 IMPLANT
NEEDLE MAYO TAPER (NEEDLE) ×2
NS IRRIG 1000ML POUR BTL (IV SOLUTION) ×3 IMPLANT
PACK TOTAL JOINT (CUSTOM PROCEDURE TRAY) ×3 IMPLANT
PAD ARMBOARD 7.5X6 YLW CONV (MISCELLANEOUS) ×6 IMPLANT
PAD CAST 4YDX4 CTTN HI CHSV (CAST SUPPLIES) ×1 IMPLANT
PADDING CAST COTTON 4X4 STRL (CAST SUPPLIES) ×2
PADDING CAST COTTON 6X4 STRL (CAST SUPPLIES) ×3 IMPLANT
PLATE VA-LCP TIBIA 8H 3.5MM (Plate) ×3 IMPLANT
RETRIEVER SUT HEWSON (MISCELLANEOUS) ×3 IMPLANT
SCREW CORTEX 3.5 32MM (Screw) ×2 IMPLANT
SCREW CORTEX 3.5 34MM (Screw) ×2 IMPLANT
SCREW CORTEX 3.5 38MM (Screw) ×2 IMPLANT
SCREW HEADED ST 3.5X48 (Screw) ×3 IMPLANT
SCREW HEADED ST 3.5X50 (Screw) ×3 IMPLANT
SCREW HEADED ST 3.5X75 (Screw) ×3 IMPLANT
SCREW HEADED ST 3.5X85 (Screw) ×3 IMPLANT
SCREW LOCK CORT ST 3.5X32 (Screw) ×1 IMPLANT
SCREW LOCK CORT ST 3.5X34 (Screw) ×1 IMPLANT
SCREW LOCK CORT ST 3.5X38 (Screw) ×1 IMPLANT
SCREW LOCK ST VA-LCP 3.5X52 (Screw) ×3 IMPLANT
SCREW LOCKING 3.5X80MM VA (Screw) ×6 IMPLANT
SCREW LOCKING VA 3.5X75MM (Screw) ×3 IMPLANT
STAPLER VISISTAT 35W (STAPLE) ×3 IMPLANT
SUCTION FRAZIER HANDLE 10FR (MISCELLANEOUS) ×2
SUCTION TUBE FRAZIER 10FR DISP (MISCELLANEOUS) ×1 IMPLANT
SUT ETHILON 2 0 FS 18 (SUTURE) ×3 IMPLANT
SUT ETHILON 3 0 PS 1 (SUTURE) ×3 IMPLANT
SUT FIBERWIRE #2 38 T-5 BLUE (SUTURE)
SUT VIC AB 0 CT1 27 (SUTURE) ×4
SUT VIC AB 0 CT1 27XBRD ANBCTR (SUTURE) ×2 IMPLANT
SUT VIC AB 1 CT1 18XCR BRD 8 (SUTURE) IMPLANT
SUT VIC AB 1 CT1 27 (SUTURE) ×2
SUT VIC AB 1 CT1 27XBRD ANBCTR (SUTURE) ×1 IMPLANT
SUT VIC AB 1 CT1 8-18 (SUTURE)
SUT VIC AB 2-0 CT1 27 (SUTURE) ×4
SUT VIC AB 2-0 CT1 TAPERPNT 27 (SUTURE) ×2 IMPLANT
SUTURE FIBERWR #2 38 T-5 BLUE (SUTURE) IMPLANT
TOWEL OR 17X26 10 PK STRL BLUE (TOWEL DISPOSABLE) ×6 IMPLANT
TRAY FOLEY MTR SLVR 16FR STAT (SET/KITS/TRAYS/PACK) IMPLANT
WATER STERILE IRR 1000ML POUR (IV SOLUTION) ×6 IMPLANT

## 2019-01-06 NOTE — Plan of Care (Signed)
  Problem: Education: Goal: Knowledge of General Education information will improve Description: Including pain rating scale, medication(s)/side effects and non-pharmacologic comfort measures Outcome: Progressing   Problem: Clinical Measurements: Goal: Ability to maintain clinical measurements within normal limits will improve Outcome: Progressing   Problem: Safety: Goal: Ability to remain free from injury will improve Outcome: Progressing   

## 2019-01-06 NOTE — Progress Notes (Signed)
Patient is currently NPO pending surgery. All drinks and food removed from bedside table. Nursing will continue to monitor.

## 2019-01-06 NOTE — Anesthesia Preprocedure Evaluation (Addendum)
Anesthesia Evaluation  Patient identified by MRN, date of birth, ID band Patient awake    Reviewed: Allergy & Precautions, H&P , NPO status , Patient's Chart, lab work & pertinent test results  Airway Mallampati: II  TM Distance: >3 FB Neck ROM: Full    Dental no notable dental hx. (+) Teeth Intact, Dental Advisory Given   Pulmonary Current Smoker,    Pulmonary exam normal breath sounds clear to auscultation       Cardiovascular hypertension,  Rhythm:Regular Rate:Normal     Neuro/Psych negative neurological ROS  negative psych ROS   GI/Hepatic negative GI ROS, Neg liver ROS,   Endo/Other  negative endocrine ROS  Renal/GU Renal InsufficiencyRenal disease  negative genitourinary   Musculoskeletal   Abdominal   Peds  Hematology negative hematology ROS (+)   Anesthesia Other Findings   Reproductive/Obstetrics negative OB ROS                            Anesthesia Physical Anesthesia Plan  ASA: II  Anesthesia Plan: General   Post-op Pain Management:    Induction: Intravenous  PONV Risk Score and Plan: 2 and Ondansetron, Dexamethasone and Midazolam  Airway Management Planned: Oral ETT  Additional Equipment:   Intra-op Plan:   Post-operative Plan: Extubation in OR  Informed Consent: I have reviewed the patients History and Physical, chart, labs and discussed the procedure including the risks, benefits and alternatives for the proposed anesthesia with the patient or authorized representative who has indicated his/her understanding and acceptance.     Dental advisory given  Plan Discussed with: CRNA  Anesthesia Plan Comments:         Anesthesia Quick Evaluation

## 2019-01-06 NOTE — Op Note (Signed)
Orthopaedic Surgery Operative Note (CSN: [id number] ) Date of Surgery: 01/06/2019  Admit Date: 01/04/2019   Diagnoses: Pre-Op Diagnoses: Left bicondylar tibial plateau fracture   Post-Op Diagnosis: Same  Procedures: 1. CPT 27536-Open reduction internal fixation of left bicondylar tibial plateau fracture 2. CPT 27540-Open reduction internal fixation of intercondylar spine fractures  Surgeons : Primary: Roby LoftsHaddix,  P, MD  Assistant: Ulyses SouthwardSarah Yacobi, PA-C  Location: OR 3   Anesthesia:General   Antibiotics: Ancef 2g preop   Tourniquet time: Total Tourniquet Time Documented: Thigh (Left) - 118 minutes Total: Thigh (Left) - 118 minutes    Estimated Blood Loss:250 mL  Complications:None   Specimens:None   Implants: Implant Name Type Inv. Item Serial No. Manufacturer Lot No. LRB No. Used  SCREW HEADED ST 3.5X75 - [id number] Screw SCREW HEADED ST 3.5X75  [employer]  Left 1  SCREW HEADED ST 3.5X85 - [id number] Screw SCREW HEADED ST 3.5X85  [employer]  Left 1  SCREW CORTEX 3.5 38MM - [id number] Screw SCREW CORTEX 3.5 38MM  [employer]  Left 1  SCREW CORTEX 3.5 32MM - [id number] Screw SCREW CORTEX 3.5 32MM  [employer]  Left 1  PLATE VA-LCP TIBIA 8H 3.5MM - [id number] Plate PLATE VA-LCP TIBIA 8H 3.5MM  [employer]  Left 1  SCREW CORTEX 3.5 34MM - [id number] Screw SCREW CORTEX 3.5 34MM  [employer]  Left 1  SCREW LOCKING 3.5X80MM VA - [id number] Screw SCREW LOCKING 3.5X80MM VA  [employer]  Left 2  SCREW HEADED ST 3.5X48 - [id number] Screw SCREW HEADED ST 3.5X48  [employer]  Left 1  SCREW HEADED ST 3.5X50 - [id number] Screw SCREW HEADED ST 3.5X50  [employer]  Left 1  SCREW LOCKING VA 3.5X75MM - [id number] Screw SCREW LOCKING VA 3.5X75MM  [employer]  Left 1  3.5 Locking Screw 52 mm Screw   [employer]  Left 1     Indications for Surgery: 53 year old who fell 10 feet from a ladder and had immediate pain and deformity to his left left lower extremity.   X-rays and CT scan showed a displaced comminuted bicondylar tibial plateau fracture.  I recommend proceeding with open reduction internal fixation. Risks and benefits were discussed with the patient.  Risks included but not limited to bleeding, infection, malunion, nonunion, posttraumatic arthritis, knee stiffness, nerve and blood vessel injury, DVT.  Patient agrees to proceed with surgery and consent was obtained.  Operative Findings: 1.  Comminuted bicondylar tibial plateau with significant lateral joint and intercondylar spine involvement. 2.  Open reduction internal fixation of tibial plateau fracture using Synthes VA 6 hole proximal tibial locking plate 3.  Open reduction internal fixation of tibial spines using #2 FiberWire through ACL stump pulled down and tied to the proximal tibial locking plate.  Procedure: The patient was identified in the preoperative holding area. Consent was confirmed with the patient and their family and all questions were answered. The operative extremity was marked after confirmation with the patient. he was then brought back to the operating room by our anesthesia colleagues.  He was placed under general anesthetic and carefully transferred over to a radiolucent flat top table.  A nonsterile tourniquet was placed to his upper thigh.  A bump was placed under his operative hip.The operative extremity was then prepped and draped in usual sterile fashion. A preoperative timeout was performed to verify the patient, the procedure, and the extremity. Preoperative antibiotics were dosed.  An anterior lateral incision was made after the tourniquet was inflated to  300 mmHg.  Is carried down through skin and subcutaneous tissue.  The IT band was incised just lateral to the patella and patellar tendon.  I developed the interval taking the IT band and the capsule.  I released the IT band off of the lateral cortex of the tibia and performed a subperiosteal dissection back to the  fibular head.  There was a lateral split that was able to enter to manipulate and address the lateral joint.  A sub-meniscal arthrotomy was made with a 15 blade.  Vicryl sutures were used to tag the capsule.  The meniscus was without a tear.  However the insertion of the ACL stump as well as the anterior horn of the lateral meniscus was on a fragment of bone that was unstable.  I then focused on restoring the articular portion of the lateral joint.  There is some impaction of the posterior lateral aspect of the joint which I was able to elevate back up and hold it provisionally with a K wire.  I was able to anatomically reduce the lateral split in the posterior lateral joint with the metaphysis and remainder of the lateral condyle.  There is significant comminution of the lateral joint is some delamination of articular cartilage.  I then made a percutaneous incision on the posterior medial aspect of the tibia and clamped the large medial fragment back to the tibial shaft.  A near anatomic reduction was able to be obtained.  This clamp was held in place until the fixation was complete.  A 3.5 mm lag screw was used to compress the lateral condyle to the medial condyle.  And then submuscularly slid a 6 hole Synthes 3.5 mm LCP proximal tibial locking plate along the lateral cortex.  I confirmed adequate placement with AP and lateral fluoroscopic images and held it provisionally with a K wire.  A nonlocking screw was placed in the proximal segment to bring the proximal portion of the plate flush to bone.  Percutaneous incision was made along the tibial shaft to bring the distal portion of the plate flush to the lateral cortex.  Another two nonlocking screws were placed in the tibial shaft.  Three locking screws were placed into the proximal articular segment.  Another locking screw was placed into the metaphyseal region to reinforce the medial condyle fragment.  There was some anterior translation secondary to the  incompetent ACL fragment.  I then performed a arthrotomy proximal to the meniscus to visualize the ACL stump.  I used #2 FiberWire to throw sutures through the stump.  I then drilled through the plate and passed the sutures through the plate and tied it down to reinforce the ACL and anterior horn of lateral meniscus.  There was less anterior translation with drawer exam.  The incisions were then copiously irrigated.  The arthrotomy was closed with a 0 Vicryl suture.  A gram of vancomycin powder was placed into the incision.  The IT band was closed with 0 Vicryl suture after the capsule tag stitches were pulled down and tied to the plate.  The skin was closed with 2-0 Vicryl and 3-0 nylon.  The percutaneous incisions were closed with 3-0 nylon.  Sterile dressing consisting of bacitracin ointment, Adaptic, 4 x 4's and sterile cast padding was placed.  The patient was placed in Ace wraps in a knee immobilizer.  He was then awoken from anesthesia and taken to PACU in stable condition.  Post Op Plan/Instructions: Patient will be nonweightbearing to left lower  extremity.  He received postoperative Ancef.  He will receive Lovenox for DVT prophylaxis.  He will mobilize with physical and Occupational Therapy.  Dressing change will be on postoperative day 2.  I was present and performed the entire surgery.  Ulyses SouthwardSarah Yacobi, PA-C did assist me throughout the case. An assistant was necessary given the difficulty in approach, maintenance of reduction and ability to instrument the fracture.   Truitt MerleKevin , MD [facility]

## 2019-01-06 NOTE — Anesthesia Procedure Notes (Signed)
Procedure Name: Intubation Date/Time: 01/06/2019 3:42 PM Performed by: Elayne Snare, CRNA Pre-anesthesia Checklist: Patient identified, Emergency Drugs available, Suction available and Patient being monitored Patient Re-evaluated:Patient Re-evaluated prior to induction Oxygen Delivery Method: Circle System Utilized Preoxygenation: Pre-oxygenation with 100% oxygen Induction Type: IV induction and Rapid sequence Laryngoscope Size: Mac and 4 Grade View: Grade I Tube type: Oral Tube size: 7.5 mm Number of attempts: 1 Airway Equipment and Method: Stylet Placement Confirmation: ETT inserted through vocal cords under direct vision,  positive ETCO2 and breath sounds checked- equal and bilateral Secured at: 23 cm Tube secured with: Tape Dental Injury: Teeth and Oropharynx as per pre-operative assessment

## 2019-01-06 NOTE — Progress Notes (Signed)
Spoke with Marya Amsler from [place], he will bring hinged knee brace in the morning.

## 2019-01-06 NOTE — Consult Note (Signed)
Orthopaedic Trauma Service (OTS) Consult   Patient ID: Corey Landry MRN: [id number] DOB/AGE: Feb 29, 1966 53 y.o.  Reason for Consult: Left tibial plateau fracture Referring Physician: Dr. Veverly Fells Rosanne Gutting)  HPI: Corey Landry is an 53 y.o. male being seen in consultation at the request of Dr. Veverly Fells for left tibial plateau fracture. Patient fell form 10 foot ladder on Saturday evening. Had immediate pain and deformity of the left knee, and was unable to stand or bear weight on the leg. Was seen in [facility] ED where imaging revealed displaced fracture of left tibial plateau. Orthopedics was consulted. Patient placed in knee immobilizer and admitted to orthopedic service with plan for definitive surgical fixation. Due to complexity of injury, Dr. Veverly Fells felt this would best be managed by and orthopaedic traumatologist and asked orthopaedic trauma service to assume care.  Patient seen this morning on 5N. Continues to have pain in left knee. Has not been up out of bed since admission. States he feels like his leg is asleep, describes it as pins and needles feeling.    Patient lives at home in [place], [place] with his wife. He works a Network engineer type job. He is currently a daily cigarette smoker. Smokes about 1 pack every 2 days. Occasionally drinks alcohol, but to no significant amount.   Past Medical History:  Diagnosis Date  . Hypertension   . Renal disorder    kidney stones    History reviewed. No pertinent surgical history.  No family history on file.  Social History:  reports that he has been smoking cigarettes. He has a 15.00 pack-year smoking history. He has never used smokeless tobacco. He reports previous alcohol use. He reports that he does not use drugs.  Allergies:  Allergies  Allergen Reactions  . Penicillins Other (See Comments)    unknown    Medications: I have reviewed the patient's current medications.  ROS: Constitutional: No fever or chills Vision: No changes in  vision ENT: No difficulty swallowing CV: No chest pain Pulm: No SOB or wheezing GI: No nausea or vomiting GU: No urgency or inability to hold urine Skin: No poor wound healing Neurologic: +tingling in left lower leg Psychiatric: No depression or anxiety Heme: No bruising Allergic: No reaction to medications or food   Exam: Blood pressure (!) 125/92, pulse 98, temperature 99 F (37.2 C), temperature source Oral, resp. rate 19, height 5\' 9"  (1.753 m), weight 102.1 kg, SpO2 92 %. General: Laying in bed, resting comfortably. NAD Orientation: Alert and oriented Mood and Affect: Mood and affect appropriate Gait: Not assessed due to known fracture Coordination and balance: Within normal limits  Left Lower Extremity: Knee immobilizer in place. No compressive wrap in place. Knee swollen but no bruising noted. Tenderness to palpation of knee and lower leg. Less tender in thigh. Knee ROM not assessed. Sensation intact distally. Dorsiflexion/planarfelxion intact.  Foot warm and well perfused. No pain on passive stretch. +DP pulse.  Right Lower Extremity: Skin without lesions. No tenderness to palpation. Full painless ROM, full strength in each muscle group without evidence of instability. Sensation intact. Wiggles toes. +DP pulse   Medical Decision Making: Imaging: AP and lateral imaging of tibia show displaced comminuted fracture of the proximal tibia with involvement of the lateral plateau extending into the metaphysis. CT of left knee confirms intra-articular fracture  Labs:  Results for orders placed or performed during the hospital encounter of 01/04/19 (from the past 24 hour(s))  MRSA PCR Screening     Status: None  Collection Time: 01/06/19  6:08 AM  Result Value Ref Range   MRSA by PCR NEGATIVE NEGATIVE     Medical history and chart was reviewed  Assessment/Plan: 53 year old male s/p fall from 2110ft  ladder resulting in left tibial plateau fracture.  Would recommend proceeding  with ORIF of left tibial plateau later today. Discussed risks and benefits of surgery. Risks discussed included bleeding requiring blood transfusion, bleeding causing a hematoma, infection, malunion, nonunion, damage to surrounding nerves and blood vessels, pain, hardware prominence or irritation, hardware failure, stiffness, post-traumatic arthritis, DVT/PE, compartment syndrome, and even death. Patient understands these risks and would like to proceed with surgery. Questions answered, consent obtained.    Antasia Haider A. Ladonna SnideYacobi, PA-C [facility] ?[phone]? (phone)

## 2019-01-06 NOTE — Anesthesia Postprocedure Evaluation (Signed)
Anesthesia Post Note  Patient: Corey Landry  Procedure(s) Performed: OPEN REDUCTION INTERNAL FIXATION (ORIF) TIBIAL PLATEAU (Left )     Patient location during evaluation: PACU Anesthesia Type: General Level of consciousness: sedated, patient cooperative and oriented Pain management: pain level controlled Vital Signs Assessment: post-procedure vital signs reviewed and stable Respiratory status: spontaneous breathing, nonlabored ventilation, respiratory function stable and patient connected to nasal cannula oxygen Cardiovascular status: blood pressure returned to baseline and stable Postop Assessment: no apparent nausea or vomiting Anesthetic complications: no    Last Vitals:  Vitals:   01/06/19 2020 01/06/19 2035  BP: (!) 120/96 (!) 139/100  Pulse: (!) 105 (!) 108  Resp: 12 12  Temp:    SpO2: 99% 97%    Last Pain:  Vitals:   01/06/19 2020  TempSrc:   PainSc: Asleep                 Iara Monds,E. Patriece Archbold

## 2019-01-06 NOTE — Progress Notes (Signed)
   Subjective: Recheck left lower leg s/p tibial plateau fracture Plan for ortho trauma surgery evaluation this morning with hopeful surgery later today Pt currently NPO Pain is moderate at this time Denies any new symptoms or issues Patient reports pain as moderate.  Objective:   VITALS:   Vitals:   01/06/19 0323 01/06/19 0806  BP: (!) 125/92 118/86  Pulse: 98 (!) 105  Resp: 19 18  Temp: 99 F (37.2 C) 98.8 F (37.1 C)  SpO2: 92% 94%    Left lower extremity currently in knee immobilizer nv intact distally Mild edema extending down into left foot  LABS Recent Labs    01/04/19 2027  HGB 14.9  HCT 44.8  WBC 13.2*  PLT 267    Recent Labs    01/04/19 2027  NA 136  K 4.1  BUN 14  CREATININE 1.56*  GLUCOSE 122*     Assessment/Plan: Left tibial plateau fracture Orthopedic trauma surgery evaluation later today with plan to definitively fix the tibia vs external fixation Pain management Continue NPO and non weight bearing Will continue to monitor his progress    Merla Riches PA-C, [place] [facility] now [employer] [facility] [address], [place], [place] [address] Phone: [phone] [email or web address] Facebook  [employer]

## 2019-01-06 NOTE — Progress Notes (Signed)
Dr. Doreatha Martin made aware that patient reported an allergy to PCN with unknown reaction. Per Dr. Doreatha Martin ok to give Ancef.

## 2019-01-06 NOTE — Transfer of Care (Signed)
Immediate Anesthesia Transfer of Care Note  Patient: Corey Landry  Procedure(s) Performed: OPEN REDUCTION INTERNAL FIXATION (ORIF) TIBIAL PLATEAU (Left )  Patient Location: PACU  Anesthesia Type:General  Level of Consciousness: drowsy and patient cooperative  Airway & Oxygen Therapy: Patient Spontanous Breathing and Patient connected to face mask oxygen  Post-op Assessment: Report given to RN and Post -op Vital signs reviewed and stable  Post vital signs: Reviewed and stable  Last Vitals:  Vitals Value Taken Time  BP 123/80 01/06/2019  6:54 PM  Temp    Pulse 104 01/06/2019  6:55 PM  Resp 19 01/06/2019  6:55 PM  SpO2 100 % 01/06/2019  6:55 PM  Vitals shown include unvalidated device data.  Last Pain:  Vitals:   01/06/19 1532  TempSrc:   PainSc: 6       Patients Stated Pain Goal: 3 (29/24/46 2863)  Complications: No apparent anesthesia complications

## 2019-01-07 ENCOUNTER — Encounter (HOSPITAL_COMMUNITY): Payer: Self-pay | Admitting: *Deleted

## 2019-01-07 LAB — CBC
HCT: 37.1 % — ABNORMAL LOW (ref 39.0–52.0)
Hemoglobin: 12.6 g/dL — ABNORMAL LOW (ref 13.0–17.0)
MCH: 31 pg (ref 26.0–34.0)
MCHC: 34 g/dL (ref 30.0–36.0)
MCV: 91.4 fL (ref 80.0–100.0)
Platelets: 215 10*3/uL (ref 150–400)
RBC: 4.06 MIL/uL — ABNORMAL LOW (ref 4.22–5.81)
RDW: 11.9 % (ref 11.5–15.5)
WBC: 12.7 10*3/uL — ABNORMAL HIGH (ref 4.0–10.5)
nRBC: 0 % (ref 0.0–0.2)

## 2019-01-07 MED ORDER — CALCIUM CARBONATE ANTACID 500 MG PO CHEW
1.0000 | CHEWABLE_TABLET | Freq: Two times a day (BID) | ORAL | Status: DC | PRN
  Administered 2019-01-07: 200 mg via ORAL
  Filled 2019-01-07: qty 1

## 2019-01-07 NOTE — Evaluation (Signed)
Physical Therapy Evaluation Patient Details Name: Corey Landry MRN: [id number] DOB: [birth year] Today's Date: 01/07/2019   History of Present Illness  Pt is a 53 y.o. male admitted 01/04/19 after fall from ladder sustaining displaced left bicondylar tibial plateau fx. S/p L tibial ORIF 6/8. PMH includes HTN.    Clinical Impression  Pt presents with an overall decrease in functional mobility secondary to above. PTA, pt indep, works active job and lives with supportive wife. Educ on precautions, positioning, brace wear, and importance of mobility. Today, pt able to initiate gait training with RW and crutches; good ability to maintain LLE NWB. Able to achieve ~80' L knee flexion in unlocked hinge brace, unable to achieve full extension. Pt would benefit from continued acute PT services to maximize functional mobility and independence prior to d/c home.     Follow Up Recommendations No PT follow up;Supervision for mobility/OOB(may benefit from outpatient ortho PT when WB orders increase)    Equipment Recommendations  Rolling walker with 5" wheels    Recommendations for Other Services       Precautions / Restrictions Precautions Precautions: Fall Required Braces or Orthoses: Other Brace Other Brace: L knee hinged brace unlocked during day, locked in extension overnight Restrictions Weight Bearing Restrictions: Yes LLE Weight Bearing: Non weight bearing      Mobility  Bed Mobility Overal bed mobility: Modified Independent             General bed mobility comments: HOB elevated; reliant on BUEs to assist LLE to EOB  Transfers Overall transfer level: Needs assistance Equipment used: Rolling walker (2 wheeled);Crutches Transfers: Sit to/from Stand Sit to Stand: Min guard;From elevated surface         General transfer comment: Trialled sit<>stand with RW and bilateral axillary cruthces, educ for technique with both; min guard for safety/balance with  each  Ambulation/Gait Ambulation/Gait assistance: Min guard Gait Distance (Feet): 40 Feet Assistive device: Rolling walker (2 wheeled);Crutches Gait Pattern/deviations: Step-to pattern Gait velocity: Decreased Gait velocity interpretation: <1.8 ft/sec, indicate of risk for recurrent falls General Gait Details: Slow, antalgic gait with good ability to utilize hop-to step pattern on RLE; trialled with RW and crutches, min guard for balance. Stability improved with RW; pt unsure which he will prefer to use at home. Educ on difference between two  Stairs            Wheelchair Mobility    Modified Rankin (Stroke Patients Only)       Balance Overall balance assessment: Needs assistance   Sitting balance-Leahy Scale: Good       Standing balance-Leahy Scale: Poor Standing balance comment: Reliant on UE support                             Pertinent Vitals/Pain Pain Assessment: Faces Faces Pain Scale: Hurts even more Pain Location: L knee/lower leg Pain Descriptors / Indicators: Grimacing;Guarding Pain Intervention(s): Limited activity within patient's tolerance;Repositioned    Home Living Family/patient expects to be discharged to:: Private residence Living Arrangements: Spouse/significant other Available Help at Discharge: Family;Available 24 hours/day Type of Home: House Home Access: Stairs to enter Entrance Stairs-Rails: None Entrance Stairs-Number of Steps: 3 Home Layout: One level   Additional Comments: Wife is working on getting crutches    Prior Function Level of Independence: Independent         Comments: Works full-time     Geneticist, molecular  Upper Extremity Assessment Upper Extremity Assessment: Overall WFL for tasks assessed    Lower Extremity Assessment Lower Extremity Assessment: LLE deficits/detail LLE Deficits / Details: s/p L tibial ORIF; knee flex/ext <3/5, limited by pain LLE: Unable to  fully assess due to pain LLE Coordination: decreased gross motor;decreased fine motor       Communication   Communication: No difficulties  Cognition Arousal/Alertness: Awake/alert Behavior During Therapy: WFL for tasks assessed/performed Overall Cognitive Status: Within Functional Limits for tasks assessed                                        General Comments      Exercises General Exercises - Lower Extremity Heel Slides: AAROM;Left;Seated(bed raised to keep ankle off floor, reliant on BUEs to lift L thigh)   Assessment/Plan    PT Assessment Patient needs continued PT services  PT Problem List Decreased strength;Decreased range of motion;Decreased activity tolerance;Decreased balance;Decreased mobility;Decreased knowledge of use of DME;Decreased safety awareness;Decreased knowledge of precautions;Pain       PT Treatment Interventions DME instruction;Gait training;Stair training;Functional mobility training;Therapeutic activities;Therapeutic exercise;Balance training;Patient/family education    PT Goals (Current goals can be found in the Care Plan section)  Acute Rehab PT Goals Patient Stated Goal: Return to work sooner rather than later PT Goal Formulation: With patient Time For Goal Achievement: 01/21/19 Potential to Achieve Goals: Good    Frequency Min 5X/week   Barriers to discharge        Co-evaluation               AM-PAC PT "6 Clicks" Mobility  Outcome Measure Help needed turning from your back to your side while in a flat bed without using bedrails?: None Help needed moving from lying on your back to sitting on the side of a flat bed without using bedrails?: None Help needed moving to and from a bed to a chair (including a wheelchair)?: A Little Help needed standing up from a chair using your arms (e.g., wheelchair or bedside chair)?: A Little Help needed to walk in hospital room?: A Little Help needed climbing 3-5 steps with a  railing? : A Little 6 Click Score: 20    End of Session Equipment Utilized During Treatment: Gait belt Activity Tolerance: Patient tolerated treatment well Patient left: in chair;with call bell/phone within reach Nurse Communication: Mobility status PT Visit Diagnosis: Other abnormalities of gait and mobility (R26.89);Pain Pain - Right/Left: Left Pain - part of body: Leg    Time: 1019-1050 PT Time Calculation (min) (ACUTE ONLY): 31 min   Charges:   PT Evaluation $PT Eval Moderate Complexity: 1 Mod PT Treatments $Gait Training: 8-22 mins      Ina HomesJaclyn Willadeen Colantuono, PT, DPT Acute Rehabilitation Services  Pager [phone] Office [phone]  Malachy ChamberJaclyn L Hardin Hardenbrook 01/07/2019, 11:22 AM

## 2019-01-07 NOTE — Progress Notes (Signed)
Orthopedic Tech Progress Note Patient Details:  Corey Landry 1966-06-11 [id number] Called in order to [facility] for [place]. Patient ID: [name], male   DOB: February 14, 1966, 53 y.o.   MRN: [id number]   [name] 01/07/2019, 8:35 AM

## 2019-01-07 NOTE — Evaluation (Signed)
Occupational Therapy Evaluation Patient Details Name: Corey LoganSteve Nobrega MRN: [id number] DOB: February 14, 1966 Today's Date: 01/07/2019    History of Present Illness Pt is a 53 y.o. male admitted 01/04/19 after fall from ladder sustaining displaced left bicondylar tibial plateau fx. S/p L tibial ORIF 6/8. PMH includes HTN.   Clinical Impression   Pt admitted with the above diagnoses and presents with below problem list. Pt will benefit from continued acute OT to address the below listed deficits and maximize independence with basic ADLs prior to d/c home. PTA pt was independent with ADLs, works full-time. Pt is currently min guard with functional mobility and transfers, min A with LB ADLs, discussed AE. Began education on tub transfer technique(s) using 3n1 as shower seat, handout provided.      Follow Up Recommendations  No OT follow up;Supervision - Intermittent    Equipment Recommendations  3 in 1 bedside commode    Recommendations for Other Services       Precautions / Restrictions Precautions Precautions: Fall Required Braces or Orthoses: Other Brace Other Brace: L knee hinged brace unlocked during day, locked in extension overnight Restrictions Weight Bearing Restrictions: Yes LLE Weight Bearing: Non weight bearing      Mobility Bed Mobility Overal bed mobility: Modified Independent             General bed mobility comments: HOB elevated; reliant on BUEs to assist LLE to EOB  Transfers Overall transfer level: Needs assistance Equipment used: Rolling walker (2 wheeled) Transfers: Sit to/from Stand Sit to Stand: Min guard;From elevated surface         General transfer comment: to/from EOB. min guard for safety    Balance Overall balance assessment: Needs assistance   Sitting balance-Leahy Scale: Good       Standing balance-Leahy Scale: Poor Standing balance comment: Reliant on UE support                           ADL either performed or assessed  with clinical judgement   ADL Overall ADL's : Needs assistance/impaired Eating/Feeding: Set up;Sitting   Grooming: Set up;Sitting   Upper Body Bathing: Set up;Sitting   Lower Body Bathing: Min guard;Sit to/from stand;Minimal assistance;With adaptive equipment   Upper Body Dressing : Set up;Sitting   Lower Body Dressing: Min guard;Sit to/from stand;With adaptive equipment   Toilet Transfer: Min guard;Ambulation;Comfort height toilet;Grab bars;RW   Toileting- ArchitectClothing Manipulation and Hygiene: Min guard;Sit to/from stand   Tub/ Shower Transfer: Tub transfer;Minimal assistance;3 in 1;Min guard;Ambulation Tub/Shower Transfer Details (indicate cue type and reason): provided handout on 3n1 use for tub shower transfer and discussed technique. Also discussed alternatives as pt is unsure if space will allow for rw.  Functional mobility during ADLs: Min guard;Rolling walker General ADL Comments: Pt completed household distance functional mobility. educated on LB ADL technique and AE, tub transfer technique(s).     Vision         Perception     Praxis      Pertinent Vitals/Pain Pain Assessment: Faces Faces Pain Scale: Hurts even more Pain Location: L knee/lower leg Pain Descriptors / Indicators: Grimacing;Guarding Pain Intervention(s): Monitored during session;Limited activity within patient's tolerance;Repositioned     Hand Dominance     Extremity/Trunk Assessment Upper Extremity Assessment Upper Extremity Assessment: Overall WFL for tasks assessed   Lower Extremity Assessment Lower Extremity Assessment: Defer to PT evaluation LLE Deficits / Details: s/p L tibial ORIF; knee flex/ext <3/5, limited by pain LLE: Unable  to fully assess due to pain LLE Coordination: decreased gross motor;decreased fine motor       Communication Communication Communication: No difficulties   Cognition Arousal/Alertness: Awake/alert Behavior During Therapy: WFL for tasks  assessed/performed Overall Cognitive Status: Within Functional Limits for tasks assessed                                     General Comments       Exercises General Exercises - Lower Extremity Heel Slides: AAROM;Left;Seated(bed raised to keep ankle off floor, reliant on BUEs to lift L thigh)   Shoulder Instructions      Home Living Family/patient expects to be discharged to:: Private residence Living Arrangements: Spouse/significant other Available Help at Discharge: Family;Available 24 hours/day Type of Home: House Home Access: Stairs to enter [employer] of Steps: 3 Entrance Stairs-Rails: None Home Layout: One level     Bathroom Shower/Tub: Teacher, early years/pre: Handicapped height         Additional Comments: Wife is working on getting crutches      Prior Functioning/Environment Level of Independence: Independent        Comments: Works full-time        OT Problem List: Impaired balance (sitting and/or standing);Decreased knowledge of use of DME or AE;Decreased knowledge of precautions;Pain      OT Treatment/Interventions: Self-care/ADL training;DME and/or AE instruction;Therapeutic activities;Patient/family education;Balance training    OT Goals(Current goals can be found in the care plan section) Acute Rehab OT Goals Patient Stated Goal: Return to work sooner rather than later OT Goal Formulation: With patient Time For Goal Achievement: 01/14/19 Potential to Achieve Goals: Good ADL Goals Pt Will Perform Lower Body Bathing: with modified independence;sit to/from stand Pt Will Perform Lower Body Dressing: with modified independence;sit to/from stand Pt Will Transfer to Toilet: with modified independence;ambulating Pt Will Perform Toileting - Clothing Manipulation and hygiene: with modified independence;sit to/from stand Pt Will Perform Tub/Shower Transfer: Tub transfer;with modified independence;ambulating;3 in  1;rolling walker  OT Frequency: Min 2X/week   Barriers to D/C:            Co-evaluation              AM-PAC OT "6 Clicks" Daily Activity     Outcome Measure Help from another person eating meals?: None Help from another person taking care of personal grooming?: None Help from another person toileting, which includes using toliet, bedpan, or urinal?: None Help from another person bathing (including washing, rinsing, drying)?: A Little Help from another person to put on and taking off regular upper body clothing?: None Help from another person to put on and taking off regular lower body clothing?: A Little 6 Click Score: 22   End of Session Equipment Utilized During Treatment: Rolling walker  Activity Tolerance: Patient tolerated treatment well Patient left: in bed;with call bell/phone within reach  OT Visit Diagnosis: Unsteadiness on feet (R26.81);Pain Pain - Right/Left: Left Pain - part of body: Leg                Time: 1348-1410 OT Time Calculation (min): 22 min Charges:  OT General Charges $OT Visit: 1 Visit OT Evaluation $OT Eval Low Complexity: [place], OT Acute Rehabilitation Services Pager: [phone] Office: [phone]   Hortencia Pilar 01/07/2019, 2:24 PM

## 2019-01-07 NOTE — Progress Notes (Signed)
Orthopaedic Trauma Progress Note  S: Patient doing well this AM, pain well controlled. Has not been up out of bed yet. No BM yet. Hinge knee brace will be ordered and delivered this morning. Discussed procedure yesterday, he has no questions or concerns.   He is nervous to work with therapy today. Of note, patient has 3 steps to get inside his home but does have a ramp that does up those steps that he can use.  O:  Vitals:   01/07/19 0320 01/07/19 0714  BP: 124/89 120/75  Pulse: (!) 102 (!) 116  Resp: 17 17  Temp: 98.7 F (37.1 C) 98.6 F (37 C)  SpO2: 95% 96%    General - Sitting up in bed, NAD Cardiac - Heart regular rhythm, slightly tachycardic Respiratory - No increased work of breathing, lungs CTA anterior lung fields bilaterally Left Lower Extremity - Knee immobilizer in place. Dressing clean, dry, intact. Hold his foot in a bit of plantarflexion. Tenderness to palpation of knee and lower leg. Non-tender in thigh. Lower leg is swollen but compartments are compressible. Weak active plantarflexion and dorsiflexion. No pain with passive stretch. Foot warm and well perfused. Wiggles toes. Sensation intact to light touch.  +DP pulse.    Imaging: Stable post op imaging  Labs:  Results for orders placed or performed during the hospital encounter of 01/04/19 (from the past 24 hour(s))  CBC     Status: Abnormal   Collection Time: 01/07/19  4:50 AM  Result Value Ref Range   WBC 12.7 (H) 4.0 - 10.5 K/uL   RBC 4.06 (L) 4.22 - 5.81 MIL/uL   Hemoglobin 12.6 (L) 13.0 - 17.0 g/dL   HCT 37.1 (L) 39.0 - 52.0 %   MCV 91.4 80.0 - 100.0 fL   MCH 31.0 26.0 - 34.0 pg   MCHC 34.0 30.0 - 36.0 g/dL   RDW 11.9 11.5 - 15.5 %   Platelets 215 150 - 400 K/uL   nRBC 0.0 0.0 - 0.2 %    Assessment: 53 year old male s/p fall form 10 foot ladder  Injuries: Left bicondylar tibial plateau fracture s/p ORIF  Weightbearing: NWB LLE  Insicional and dressing care: Dressing c/d/i. Will plan to change  tomorrow  Orthopedic device(s): Hinge knee brace LLE  Okay for brace to be unlocked during the day for range of motion. brace to be locked in full extension at night  CV/Blood loss:Acute blood loss anemia. Hgb 12.6 this AM. Patient slightl;y tachycardic, likely due to pain  Pain management:  1. Tylenol 650 mg q 6 hours scheduled 2. Robaxin 500 mg q 6 hours PRN 3. Oxycodone 5-15 mg q 4 hours PRN 4. Neurontin 100 mg TID 5. Dilaudid 1mg  q 3 hours PRN  VTE prophylaxis: Lovenox starting today  ID: [id number] 2gm post op  Foley/Lines: No foley, KVO IVFs  Medical co-morbidities: HTN   Dispo: PT eval today, dispo pending. Will plan for d/c hopefully tomorrow morning   Follow - up plan: 2 weeks   Whyatt Klinger A. [facility] ?[phone]? (phone)

## 2019-01-08 ENCOUNTER — Inpatient Hospital Stay (HOSPITAL_COMMUNITY): Payer: Managed Care, Other (non HMO)

## 2019-01-08 DIAGNOSIS — M7989 Other specified soft tissue disorders: Secondary | ICD-10-CM

## 2019-01-08 DIAGNOSIS — Z9889 Other specified postprocedural states: Secondary | ICD-10-CM

## 2019-01-08 LAB — VITAMIN D 25 HYDROXY (VIT D DEFICIENCY, FRACTURES): Vit D, 25-Hydroxy: 16 ng/mL — ABNORMAL LOW (ref 30.0–100.0)

## 2019-01-08 MED ORDER — ACETAMINOPHEN 500 MG PO TABS: 500.0000 mg | ORAL_TABLET | Freq: Two times a day (BID) | ORAL | 1 refills | Status: AC

## 2019-01-08 MED ORDER — GABAPENTIN 100 MG PO CAPS: 100.0000 mg | ORAL_CAPSULE | Freq: Three times a day (TID) | ORAL | 0 refills | Status: AC

## 2019-01-08 MED ORDER — ASPIRIN EC 325 MG PO TBEC: 325.0000 mg | DELAYED_RELEASE_TABLET | Freq: Every day | ORAL | 0 refills | Status: AC

## 2019-01-08 MED ORDER — OXYCODONE HCL 5 MG PO TABS: 5.0000 mg | ORAL_TABLET | ORAL | 0 refills | Status: AC | PRN

## 2019-01-08 MED ORDER — CHOLECALCIFEROL 125 MCG (5000 UT) PO CAPS: 5000.0000 [IU] | ORAL_CAPSULE | Freq: Every day | ORAL | 0 refills | Status: AC

## 2019-01-08 MED ORDER — METHOCARBAMOL 750 MG PO TABS: 750.0000 mg | ORAL_TABLET | Freq: Four times a day (QID) | ORAL | 0 refills | Status: AC | PRN

## 2019-01-08 MED FILL — GABAPENTIN 100 MG CAPSULE: 100 | 7 days supply | Qty: 21 | Fill #0

## 2019-01-08 MED FILL — VITAMIN D3 5,000 UNIT TAB: 125 MCG | 90 days supply | Qty: 90 | Fill #0

## 2019-01-08 MED FILL — ACETAMINOPHEN EXTRA STRENGT: 500 | 30 days supply | Qty: 60 | Fill #0

## 2019-01-08 MED FILL — oxyCODONE HCL 5 MG TABS: 5 | 7 days supply | Qty: 42 | Fill #0

## 2019-01-08 MED FILL — ASPIRIN EC 325 MG TABLET: 325 | 30 days supply | Qty: 30 | Fill #0

## 2019-01-08 MED FILL — METHOCARBAMOL 750 MG TABS: 750 | 7 days supply | Qty: 28 | Fill #0

## 2019-01-08 NOTE — Progress Notes (Signed)
Orthopedic Tech Progress Note Patient Details:  Corey Landry 1966/07/11 [id number]  "For some reason there is a note in the location of splint" I'm  Not sure why. But the RN called requesting crutches for the patient.  Ortho Devices Type of Ortho Device: Crutches Ortho Device/Splint Location: lle. applied as requested by dr, with a folded towel under the knee to keep the knee bent. Ortho Device/Splint Interventions: Adjustment, Application, Ordered   Post Interventions Patient Tolerated: Well Instructions Provided: Care of device, Adjustment of device   [name] 01/08/2019, 10:57 AM

## 2019-01-08 NOTE — Progress Notes (Signed)
Pt given discharge instructions and gone over with him. Pt verbalized understanding of instructions; all questions answered. All belongings gathered to be sent home. Crutches and 3n1 delivered to room. Pt waiting on medications to be delivered to room.

## 2019-01-08 NOTE — Discharge Summary (Addendum)
Orthopaedic Trauma Service (OTS) Discharge Summary   Patient ID: Corey Landry MRN: [id number] DOB/AGE: 03/08/1966 53 y.o.  Admit date: 01/04/2019 Discharge date: 01/08/2019  Admission Diagnoses: Left tibial plateau fracture  Discharge Diagnoses:  Principal Problem:   Tibial plateau fracture, left, closed, initial encounter   Past Medical History:  Diagnosis Date  . Hypertension   . Renal disorder    kidney stones     Procedures Performed: 1. CPT 27536-Open reduction internal fixation of left bicondylar tibial plateau fracture 2. CPT 27540-Open reduction internal fixation of intercondylar spine fractures   Discharged Condition: good  Hospital Course: Patient presented to [facility] ED on 01/04/2019 afterfalling from a 10 foot ladder. Had immediate pain and deformity of the left knee, and was unable to stand or bear weight on the leg. Imaging in the ED revealed displaced fracture of left tibial plateau. Orthopedics was consulted. Was taken to operating room by Dr. Jena Gauss on 01/06/19 for ORIF of left tibial plateau. Tolerated procedure well. Was place din hinge knee brace and made non-weightbearing on left leg following the procedure. Was started on Lovenox for DVT prophylaxis starting on POD #1. Began working with therapy on POD #1. Patient found to be vitamin D deficient on post-op labs. Was started on 4,000 IU D3 daily. Venous U/S was completed on POD #2 to evaluate for left lower extremity DVT due to continued pain and swelling since surgery. Ultrasound was negative.  On 01/08/2019, the patient was tolerating diet, working well with therapies, pain well controlled, vital signs stable, dressings clean, dry, intact and felt stable for discharge to home. Patient will follow up as below and knows to call with questions or concerns.     Consults: None  Significant Diagnostic Studies: labs:  Results for orders placed or performed during the hospital encounter of 01/04/19 (from the  past 72 hour(s))  MRSA PCR Screening     Status: None   Collection Time: 01/06/19  6:08 AM  Result Value Ref Range   MRSA by PCR NEGATIVE NEGATIVE    Comment:        The GeneXpert MRSA Assay (FDA approved for NASAL specimens only), is one component of a comprehensive MRSA colonization surveillance program. It is not intended to diagnose MRSA infection nor to guide or monitor treatment for MRSA infections. Performed at [facility], [address], [place], [place] [address]   VITAMIN D 25 Hydroxy (Vit-D Deficiency, Fractures)     Status: Abnormal   Collection Time: 01/07/19  4:50 AM  Result Value Ref Range   Vit D, 25-Hydroxy 16.0 (L) 30.0 - 100.0 ng/mL    Comment: (NOTE) Vitamin D deficiency has been defined by the Institute of Medicine and an Endocrine Society practice guideline as a level of serum 25-OH vitamin D less than 20 ng/mL (1,2). The Endocrine Society went on to further define vitamin D insufficiency as a level between 21 and 29 ng/mL (2). 1. IOM (Institute of Medicine). 2010. Dietary reference   intakes for calcium and D. [place]: The   [employer]. 2. Holick MF, Binkley Benjamin Perez, Bischoff-Ferrari HA, et al.   Evaluation, treatment, and prevention of vitamin D   deficiency: an Endocrine Society clinical practice   guideline. JCEM. 2011 Jul; 96(7):1911-30. Performed At: [facility] [address] [place], [place] [id number] Jolene Schimke MD AO:[id number]   CBC     Status: Abnormal   Collection Time: 01/07/19  4:50 AM  Result Value Ref Range  WBC 12.7 (H) 4.0 - 10.5 K/uL   RBC 4.06 (L) 4.22 - 5.81 MIL/uL   Hemoglobin 12.6 (L) 13.0 - 17.0 g/dL   HCT 78.237.1 (L) 95.639.0 - 21.352.0 %   MCV 91.4 80.0 - 100.0 fL   MCH 31.0 26.0 - 34.0 pg   MCHC 34.0 30.0 - 36.0 g/dL   RDW 08.611.9 57.811.5 - 46.915.5 %   Platelets 215 150 - 400 K/uL   nRBC 0.0 0.0 - 0.2 %    Comment: Performed at [facility], [address] [place], [place] [address]   Recent  Results (from the past 2160 hour(s))  CBC with Differential     Status: Abnormal   Collection Time: 01/04/19  8:27 PM  Result Value Ref Range   WBC 13.2 (H) 4.0 - 10.5 K/uL   RBC 4.82 4.22 - 5.81 MIL/uL   Hemoglobin 14.9 13.0 - 17.0 g/dL   HCT 84.144.8 32.439.0 - 40.152.0 %   MCV 92.9 80.0 - 100.0 fL   MCH 30.9 26.0 - 34.0 pg   MCHC 33.3 30.0 - 36.0 g/dL   RDW 02.712.4 25.311.5 - 66.415.5 %   Platelets 267 150 - 400 K/uL   nRBC 0.0 0.0 - 0.2 %   Neutrophils Relative % 79 %   Neutro Abs 10.4 (H) 1.7 - 7.7 K/uL   Lymphocytes Relative 13 %   Lymphs Abs 1.7 0.7 - 4.0 K/uL   Monocytes Relative 7 %   Monocytes Absolute 0.9 0.1 - 1.0 K/uL   Eosinophils Relative 0 %   Eosinophils Absolute 0.0 0.0 - 0.5 K/uL   Basophils Relative 0 %   Basophils Absolute 0.1 0.0 - 0.1 K/uL   Immature Granulocytes 1 %   Abs Immature Granulocytes 0.07 0.00 - 0.07 K/uL    Comment: Performed at [facility], [address] [place], [place] [address]  Basic metabolic panel     Status: Abnormal   Collection Time: 01/04/19  8:27 PM  Result Value Ref Range   Sodium 136 135 - 145 mmol/L   Potassium 4.1 3.5 - 5.1 mmol/L   Chloride 104 98 - 111 mmol/L   CO2 24 22 - 32 mmol/L   Glucose, Bld 122 (H) 70 - 99 mg/dL   BUN 14 6 - 20 mg/dL   Creatinine, Ser 4.251.56 (H) 0.61 - 1.24 mg/dL   Calcium 9.2 8.9 - 95.610.3 mg/dL   GFR calc non Af Amer 51 (L) >60 mL/min   GFR calc Af Amer 59 (L) >60 mL/min   Anion gap 8 5 - 15    Comment: Performed at [facility], [address] [place], [place] [address]  Protime-INR     Status: None   Collection Time: 01/04/19  8:27 PM  Result Value Ref Range   Prothrombin Time 14.9 11.4 - 15.2 seconds   INR 1.2 0.8 - 1.2    Comment: (NOTE) INR goal varies based on device and disease states. Performed at [facility], [address] [place], [place] [address]   SARS Coronavirus 2 (CEPHEID - Performed in [facility] hospital lab), Hosp Order     Status: None   Collection Time: 01/05/19 12:55  AM  Result Value Ref Range   SARS Coronavirus 2 NEGATIVE NEGATIVE    Comment: (NOTE) If result is NEGATIVE SARS-CoV-2 target nucleic acids are NOT DETECTED. The SARS-CoV-2 RNA is generally detectable in upper and lower  respiratory specimens during the acute phase of infection.  The lowest  concentration of SARS-CoV-2 viral copies this assay can detect is 250  copies / mL. A negative result does not preclude SARS-CoV-2 infection  and should not be used as the sole basis for treatment or other  patient management decisions.  A negative result may occur with  improper specimen collection / handling, submission of specimen other  than nasopharyngeal swab, presence of viral mutation(s) within the  areas targeted by this assay, and inadequate number of viral copies  (<250 copies / mL). A negative result must be combined with clinical  observations, patient history, and epidemiological information. If result is POSITIVE SARS-CoV-2 target nucleic acids are DETECTED. The SARS-CoV-2 RNA is generally detectable in upper and lower  respiratory specimens dur ing the acute phase of infection.  Positive  results are indicative of active infection with SARS-CoV-2.  Clinical  correlation with patient history and other diagnostic information is  necessary to determine patient infection status.  Positive results do  not rule out bacterial infection or co-infection with other viruses. If result is PRESUMPTIVE POSTIVE SARS-CoV-2 nucleic acids MAY BE PRESENT.   A presumptive positive result was obtained on the submitted specimen  and confirmed on repeat testing.  While 2019 novel coronavirus  (SARS-CoV-2) nucleic acids may be present in the submitted sample  additional confirmatory testing may be necessary for epidemiological  and / or clinical management purposes  to differentiate between  SARS-CoV-2 and other Sarbecovirus currently known to infect humans.  If clinically indicated additional testing  with an alternate test  methodology [phone]([id number]) is advised. The SARS-CoV-2 RNA is generally  detectable in upper and lower respiratory sp ecimens during the acute  phase of infection. The expected result is Negative. Fact Sheet for Patients:  BoilerBrush.com.cyhttps://www.fda.gov/media/136312/download Fact Sheet for Healthcare Providers: https://pope.com/https://www.fda.gov/media/136313/download This test is not yet approved or cleared by the [place] FDA and has been authorized for detection and/or diagnosis of SARS-CoV-2 by FDA under an Emergency Use Authorization (EUA).  This EUA will remain in effect (meaning this test can be used) for the duration of the COVID-19 declaration under Section 564(b)(1) of the Act, 21 U.S.C. section 360bbb-3(b)(1), unless the authorization is terminated or revoked sooner. Performed at [facility], [address] [place], [place] [address]   MRSA PCR Screening     Status: None   Collection Time: 01/06/19  6:08 AM  Result Value Ref Range   MRSA by PCR NEGATIVE NEGATIVE    Comment:        The GeneXpert MRSA Assay (FDA approved for NASAL specimens only), is one component of a comprehensive MRSA colonization surveillance program. It is not intended to diagnose MRSA infection nor to guide or monitor treatment for MRSA infections. Performed at [facility], [address] [place], [place] [address]   VITAMIN D 25 Hydroxy (Vit-D Deficiency, Fractures)     Status: Abnormal   Collection Time: 01/07/19  4:50 AM  Result Value Ref Range   Vit D, 25-Hydroxy 16.0 (L) 30.0 - 100.0 ng/mL    Comment: (NOTE) Vitamin D deficiency has been defined by the Institute of Medicine and an Endocrine Society practice guideline as a level of serum 25-OH vitamin D less than 20 ng/mL (1,2). The Endocrine Society went on to further define vitamin D insufficiency as a level between 21 and 29 ng/mL (2). 1. IOM (Institute of Medicine). 2010. Dietary reference   intakes for calcium and D.  [place]: The   [employer]. 2.  Holick MF, Binkley [place], Bischoff-Ferrari HA, et al.   Evaluation, treatment, and prevention of vitamin D   deficiency: an Endocrine Society clinical practice   guideline. JCEM. 2011 Jul; 96(7):1911-30. Performed At: [facility] [place], [place] [id number] Rush Farmer MD IN:[id number]   CBC     Status: Abnormal   Collection Time: 01/07/19  4:50 AM  Result Value Ref Range   WBC 12.7 (H) 4.0 - 10.5 K/uL   RBC 4.06 (L) 4.22 - 5.81 MIL/uL   Hemoglobin 12.6 (L) 13.0 - 17.0 g/dL   HCT 37.1 (L) 39.0 - 52.0 %   MCV 91.4 80.0 - 100.0 fL   MCH 31.0 26.0 - 34.0 pg   MCHC 34.0 30.0 - 36.0 g/dL   RDW 11.9 11.5 - 15.5 %   Platelets 215 150 - 400 K/uL   nRBC 0.0 0.0 - 0.2 %    Comment: Performed at [facility], [place] [address], [place], [place] [address]    Treatments: surgery: 1. CPT 36629-UTML reduction internal fixation of left bicondylar tibial plateau fracture 2. CPT 27540-Open reduction internal fixation of intercondylar spine fractures  Discharge Exam: General - Sitting up in bed, NAD Cardiac - Heart regular rhythm and rate Respiratory - No increased work of breathing, lungs CTA anterior lung fields bilaterally Left Lower Extremity - Hinge brace  in place. Dressing removed, incisions clean, dry, intact. Holds his foot in small amount of plantarflexion.Tenderness to palpation about the knee. Non-tender in thigh. Lower leg swollen, compartments remain compressible. Plantarflexion and dorsiflexion intact. No pain with passive stretch. Foot warm and well perfused. Wiggles toes. Sensation intact to light touch.  +DP pulse.   Disposition: Discharge disposition: 01-Home or Self Care        Allergies as of 01/08/2019      Reactions   Penicillins Other (See Comments)   unknown      Medication List    TAKE these medications   acetaminophen 500 MG tablet Commonly known as:  TYLENOL Take 1 tablet (500 mg  total) by mouth every 12 (twelve) hours.   aspirin EC 325 MG tablet Take 1 tablet (325 mg total) by mouth daily.   Cholecalciferol 125 MCG (5000 UT) capsule Commonly known as:  D3 High Potency Take 1 capsule (5,000 Units total) by mouth daily.   gabapentin 100 MG capsule Commonly known as:  NEURONTIN Take 1 capsule (100 mg total) by mouth 3 (three) times daily.   lansoprazole 15 MG capsule Commonly known as:  PREVACID Take 15 mg by mouth daily as needed (acid reflux).   methocarbamol 750 MG tablet Commonly known as:  ROBAXIN Take 1 tablet (750 mg total) by mouth every 6 (six) hours as needed for muscle spasms.   oxyCODONE 5 MG immediate release tablet Commonly known as:  Oxy IR/ROXICODONE Take 1 tablet (5 mg total) by mouth every 4 (four) hours as needed for moderate pain.   sildenafil 20 MG tablet Commonly known as:  REVATIO Take 20-40 mg by mouth daily as needed for erectile dysfunction.            Durable Medical Equipment  (From admission, onward)         Start     Ordered   01/08/19 0928  For home use only DME Walker rolling  Once    Question:  Patient needs a walker to treat with the following condition  Answer:  S/p tibial fracture   01/08/19 0927   01/08/19 4650  For home use only DME 3 n 1  Once     01/08/19 0927   01/05/19 0331  DME Walker rolling  Once    Question:  Patient needs a walker to treat with the following condition  Answer:  Tibial plateau fracture, left, closed, initial encounter   01/05/19 0330   01/05/19 0331  DME 3 n 1  Once     01/05/19 0330   01/05/19 0331  DME Bedside commode  Once    Question:  Patient needs a bedside commode to treat with the following condition  Answer:  Tibial plateau fracture, left, closed, initial encounter   01/05/19 0330         Follow-up Information    Haddix, Gillie MannersKevin P, MD. Schedule an appointment as soon as possible for a visit in 2 week(s).   Specialty:  Orthopedic Surgery Why:  suture removal, repeat  x-rays Contact information: [address] [place] [address] [phone]           Discharge Instructions and Plan: Patient will be discharged to home. He will remain non-weightbearing on the left leg. He will have unrestricted ROM of the knee in the hinge brace. He has been provided with the necessary DME for discharged. He will be on Aspirin 325 mg daily for DVT prophylaxis. Patient was found to be Vitamin D deficient on post-op labs, has been started on vitamin D and I would like for him to continue taking 5,000 IU daily after discharge. He will follow up with Dr. Jena GaussHaddix in 2 weeks for repeat x-rays and suture removal.    Signed:  Shawn RouteSarah A. Ladonna SnideYacobi, PA-C ?[phone]) [phone]? (phone) 01/08/2019, 4:02 PM     [facility] [address] [place] [address] [phone](O) [phone] (F)

## 2019-01-08 NOTE — Progress Notes (Signed)
Physical Therapy Treatment Patient Details Name: Corey Landry MRN: [id number] DOB: [birth year] Today's Date: 01/08/2019    History of Present Illness Pt is a 53 y.o. male admitted 01/04/19 after fall from ladder sustaining displaced left bicondylar tibial plateau fx. S/p L tibial ORIF 6/8. PMH includes HTN.   PT Comments    Pt progressing well with mobility. Able to increase ambulation distance, initiate stair training and stand from multiple surface heights with bilateral crutches. Pt able to achieve ~80' knee flexion with hinge brace unlocked. Good ability to maintain LLE NWB precautions. Further educ re: therex, positioning, brace wear, safety and fall risk. Pt planning for d/c home today; will have neccessary assist from wife. If to remain admitted, will follow acutely.   Follow Up Recommendations  No PT follow up;Supervision for mobility/OOB     Equipment Recommendations  Crutches    Recommendations for Other Services       Precautions / Restrictions Precautions Precautions: Fall Required Braces or Orthoses: Other Brace Other Brace: L knee hinged brace unlocked during day, locked in extension overnight Restrictions Weight Bearing Restrictions: Yes LLE Weight Bearing: Non weight bearing    Mobility  Bed Mobility Overal bed mobility: Modified Independent             General bed mobility comments: HOB elevated; reliant on BUEs to assist LLE to EOB  Transfers Overall transfer level: Needs assistance Equipment used: Crutches Transfers: Sit to/from Stand Sit to Stand: Min guard         General transfer comment: Pt able to use crutches to stand from bed, recliner, and low chair (without arm rests); min guard for safety with cruthces, but great technique to maintain RLE NWB  Ambulation/Gait Ambulation/Gait assistance: Min guard;Supervision Gait Distance (Feet): 120 Feet Assistive device: Crutches Gait Pattern/deviations: Step-to pattern Gait velocity:  Decreased Gait velocity interpretation: <1.8 ft/sec, indicate of risk for recurrent falls General Gait Details: Slow, steady gait with bilateral crutches, progressing to swing-through gait pattern on RLE; intermittent min guard for balance   Stairs Stairs: Yes Stairs assistance: Min guard Stair Management: No rails;Forwards;With crutches Number of Stairs: 3 General stair comments: Ascend/descended 3 steps with bilateral crutches, educ on technique; min guard for balance, no physical assist required   Wheelchair Mobility    Modified Rankin (Stroke Patients Only)       Balance Overall balance assessment: Needs assistance   Sitting balance-Leahy Scale: Good       Standing balance-Leahy Scale: Poor Standing balance comment: Reliant on UE support                            Cognition Arousal/Alertness: Awake/alert Behavior During Therapy: WFL for tasks assessed/performed Overall Cognitive Status: Within Functional Limits for tasks assessed                                        Exercises      General Comments        Pertinent Vitals/Pain Pain Assessment: Faces Faces Pain Scale: Hurts little more Pain Location: L knee/lower leg Pain Descriptors / Indicators: Grimacing;Guarding Pain Intervention(s): Monitored during session    Home Living                      Prior Function            PT Goals (current goals  can now be found in the care plan section) Acute Rehab PT Goals Patient Stated Goal: Return to work sooner rather than later PT Goal Formulation: With patient Time For Goal Achievement: 01/21/19 Potential to Achieve Goals: Good Progress towards PT goals: Progressing toward goals    Frequency    Min 5X/week      PT Plan Current plan remains appropriate    Co-evaluation              AM-PAC PT "6 Clicks" Mobility   Outcome Measure  Help needed turning from your back to your side while in a flat bed  without using bedrails?: None Help needed moving from lying on your back to sitting on the side of a flat bed without using bedrails?: None Help needed moving to and from a bed to a chair (including a wheelchair)?: A Little Help needed standing up from a chair using your arms (e.g., wheelchair or bedside chair)?: A Little Help needed to walk in hospital room?: A Little Help needed climbing 3-5 steps with a railing? : A Little 6 Click Score: 20    End of Session Equipment Utilized During Treatment: Gait belt Activity Tolerance: Patient tolerated treatment well Patient left: in chair;with call bell/phone within reach Nurse Communication: Mobility status PT Visit Diagnosis: Other abnormalities of gait and mobility (R26.89);Pain Pain - Right/Left: Left Pain - part of body: Leg     Time: [id number] PT Time Calculation (min) (ACUTE ONLY): 30 min  Charges:  $Gait Training: 8-22 mins $Therapeutic Activity: 8-22 mins                    Corey Landry, PT, DPT Acute Rehabilitation Services  Pager [phone] Office [phone]  Corey Landry 01/08/2019, 10:38 AM

## 2019-01-08 NOTE — Progress Notes (Signed)
Left lower extremity venous duplex completed. Refer to "CV Proc" under chart review to view preliminary results.  Preliminary results discussed with Sharyn Lull, RN.  01/08/2019 3:30 PM Maudry Mayhew, MHA, RVT, RDCS, RDMS

## 2019-01-08 NOTE — Discharge Instructions (Signed)
[facility] Discharge Instructions   General Discharge Instructions  WEIGHT BEARING STATUS: Non-weightbearing on left leg  RANGE OF MOTION/ACTIVITY: Okay for range of motion of knee in hinge knee brace  Wound Care: Can remove dressing on POD #3 (01/09/19). Okay to leave incisions open to air if no drainage. If incisions continue to have some drainage, follow wound care instructions below. Okay to shower if no drainage  DVT/PE prophylaxis: Aspirin 325 mg daily x 30 days  Diet: as you were eating previously.  Can use over the counter stool softeners and bowel preparations, such as Miralax, to help with bowel movements.  Narcotics can be constipating.  Be sure to drink plenty of fluids  PAIN MEDICATION USE AND EXPECTATIONS  You have likely been given narcotic medications to help control your pain.  After a traumatic event that results in an fracture (broken bone) with or without surgery, it is ok to use narcotic pain medications to help control one's pain.  We understand that everyone responds to pain differently and each individual patient will be evaluated on a regular basis for the continued need for narcotic medications. Ideally, narcotic medication use should last no more than 6-8 weeks (coinciding with fracture healing).   As a patient it is your responsibility as well to monitor narcotic medication use and report the amount and frequency you use these medications when you come to your office visit.   We would also advise that if you are using narcotic medications, you should take a dose prior to therapy to maximize you participation.  IF YOU ARE ON NARCOTIC MEDICATIONS IT IS NOT PERMISSIBLE TO OPERATE A MOTOR VEHICLE (MOTORCYCLE/CAR/TRUCK/MOPED) OR HEAVY MACHINERY DO NOT MIX NARCOTICS WITH OTHER CNS (CENTRAL NERVOUS SYSTEM) DEPRESSANTS SUCH AS ALCOHOL   STOP SMOKING OR USING NICOTINE PRODUCTS!!!!  As discussed nicotine severely impairs your body's ability to heal  surgical and traumatic wounds but also impairs bone healing.  Wounds and bone heal by forming microscopic blood vessels (angiogenesis) and nicotine is a vasoconstrictor (essentially, shrinks blood vessels).  Therefore, if vasoconstriction occurs to these microscopic blood vessels they essentially disappear and are unable to deliver necessary nutrients to the healing tissue.  This is one modifiable factor that you can do to dramatically increase your chances of healing your injury.    (This means no smoking, no nicotine gum, patches, etc)  DO NOT USE NONSTEROIDAL ANTI-INFLAMMATORY DRUGS (NSAID'S)  Using products such as Advil (ibuprofen), Aleve (naproxen), Motrin (ibuprofen) for additional pain control during fracture healing can delay and/or prevent the healing response.  If you would like to take over the counter (OTC) medication, Tylenol (acetaminophen) is ok.  However, some narcotic medications that are given for pain control contain acetaminophen as well. Therefore, you should not exceed more than 4000 mg of tylenol in a day if you do not have liver disease.  Also note that there are may OTC medicines, such as cold medicines and allergy medicines that my contain tylenol as well.  If you have any questions about medications and/or interactions please ask your doctor/PA or your pharmacist.      ICE AND ELEVATE INJURED/OPERATIVE EXTREMITY  Using ice and elevating the injured extremity above your heart can help with swelling and pain control.  Icing in a pulsatile fashion, such as 20 minutes on and 20 minutes off, can be followed.    Do not place ice directly on skin. Make sure there is a barrier between to skin and the ice pack.  Using frozen items such as frozen peas works well as the conform nicely to the are that needs to be iced.  USE AN ACE WRAP OR TED HOSE FOR SWELLING CONTROL  In addition to icing and elevation, Ace wraps or TED hose are used to help limit and resolve swelling.  It is  recommended to use Ace wraps or TED hose until you are informed to stop.    When using Ace Wraps start the wrapping distally (farthest away from the body) and wrap proximally (closer to the body)   Example: If you had surgery on your leg or thing and you do not have a splint on, start the ace wrap at the toes and work your way up to the thigh        If you had surgery on your upper extremity and do not have a splint on, start the ace wrap at your fingers and work your way up to the upper arm   [employer]: [phone]   VISIT OUR WEBSITE FOR ADDITIONAL INFORMATION: [email or web address]      Discharge Wound Care Instructions  Do NOT apply any ointments, solutions or lotions to pin sites or surgical wounds.  These prevent needed drainage and even though solutions like hydrogen peroxide kill bacteria, they also damage cells lining the pin sites that help fight infection.  Applying lotions or ointments can keep the wounds moist and can cause them to breakdown and open up as well. This can increase the risk for infection. When in doubt call the office.  Surgical incisions should be dressed daily.  If any drainage is noted, use one layer of adaptic, then gauze, Kerlix, and an ace wrap.  Once the incision is completely dry and without drainage, it may be left open to air out.  Showering may begin 36-48 hours later.  Cleaning gently with soap and water.  Traumatic wounds should be dressed daily as well.    One layer of adaptic, gauze, Kerlix, then ace wrap.  The adaptic can be discontinued once the draining has ceased    If you have a wet to dry dressing: wet the gauze with saline the squeeze as much saline out so the gauze is moist (not soaking wet), place moistened gauze over wound, then place a dry gauze over the moist one, followed by Kerlix wrap, then ace wrap.

## 2019-01-08 NOTE — Care Management (Signed)
Patient and wife have decided to get rolling walker now and will borrow crutches. Case Manager contacted [place] with [employer], order is in [place].    Ricki Miller, RN BSN Case Manager [phone]

## 2019-01-08 NOTE — Progress Notes (Signed)
Ortho Trauma Progress Note  I evaluated the patient prior to discharge.  Left lower extremity is significantly swollen.  The patient has significant calf tenderness on exam.  I am concerned about a DVT.  I will order an ultrasound of his lower extremity to evaluate for possible DVT.  If negative he will be able to discharge today. If positive, will likely have to keep him to start anticoagulation.  Shona Needles, MD [facility] [phone] (phone) [phone] (office) [email or web address]

## 2019-01-08 NOTE — TOC Transition Note (Signed)
Transition of Care [facility]) - CM/SW Discharge Note   Patient Details  Name: Royal Vandevoort MRN: [id number] Date of Birth: [birth year]  [facility] CM/SW Contact:  Ninfa Meeker, RN Phone Number [phone] (working remotely)  01/08/2019, 9:31 AM   Clinical Narrative:   53 yr old male admitted after a fall from ladder with a left tibial plateau fracture. S/P ORIF of left Tibial plateau fracture on 01/06/19. Patient will discharge home with family support. DME has been ordered, patient may decide on crutches versus a rolling walker.    Final next level of care: Home/Self Care Barriers to Discharge: No Barriers Identified   Patient Goals and CMS Choice     Choice offered to / list presented to : NA  Discharge Placement                       Discharge Plan and Services   Discharge Planning Services: CM Consult Post Acute Care Choice: Durable Medical Equipment          DME Arranged: 3-N-1, Walker rolling DME Agency: [employer] Date DME Agency Contacted: 01/08/19 Time DME Agency Contacted: 0930 Representative spoke with at DME Agency: [place] Arranged: NA [place] Agency: NA        Social Determinants of Health ([place]) Interventions     Readmission Risk Interventions No flowsheet data found.

## 2019-01-09 ENCOUNTER — Encounter: Payer: Self-pay | Admitting: Student

## 2019-01-09 DIAGNOSIS — I10 Essential (primary) hypertension: Secondary | ICD-10-CM | POA: Insufficient documentation

## 2019-01-09 DIAGNOSIS — S82112A Displaced fracture of left tibial spine, initial encounter for closed fracture: Secondary | ICD-10-CM | POA: Insufficient documentation

## 2020-06-11 IMAGING — CT CT RENAL STONE PROTOCOL
2 of 4 series · 16 of 46 positions shown, 18 images · non-contrast
Comparison: None.

CLINICAL DATA: Left flank pain x2 days

EXAM:
CT ABDOMEN AND PELVIS WITHOUT CONTRAST
TECHNIQUE: Multidetector CT imaging of the abdomen and pelvis was performed
following the standard protocol without IV contrast.

[Series 3: renal stone 5.0 · axial · 0.81mm/px · z∈[+604,+1074]mm · 13 of 104 slices shown, 15 images]
[im 5/104  soft-tissue]
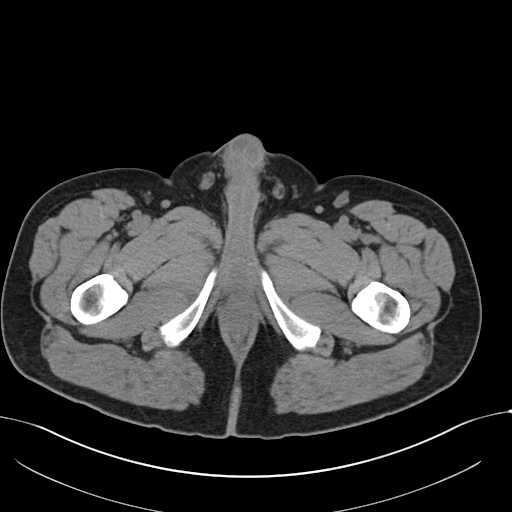
[im 5/104  bone]
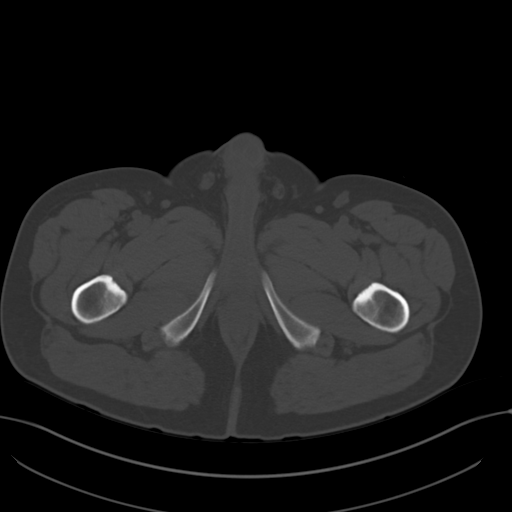
[im 14/104  soft-tissue]
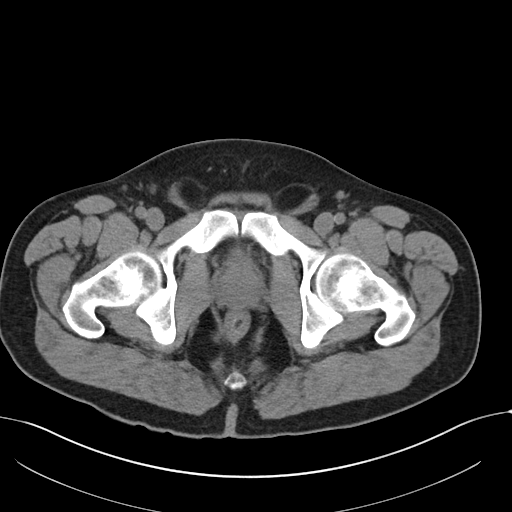
[im 23/104  soft-tissue]
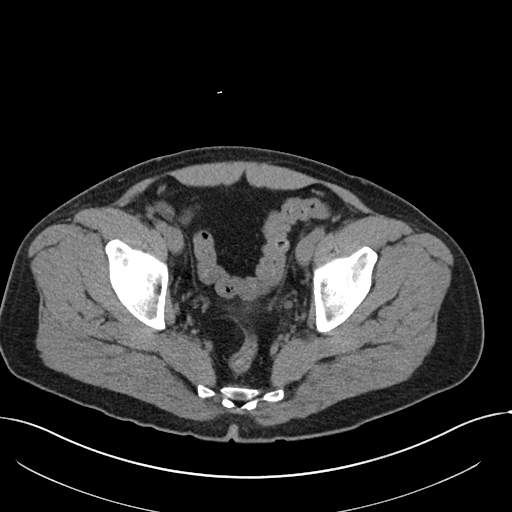
[im 27/104  soft-tissue]
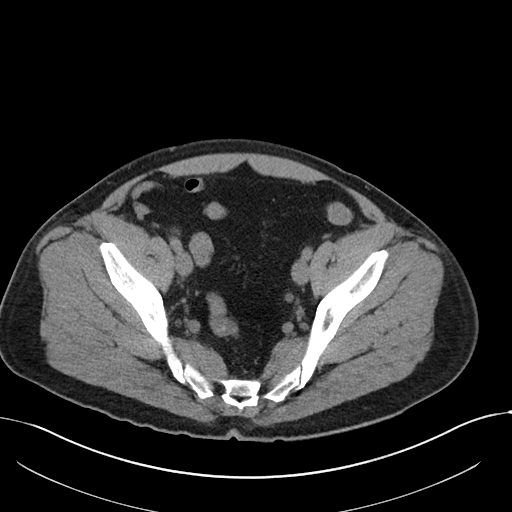
[im 36/104  soft-tissue]
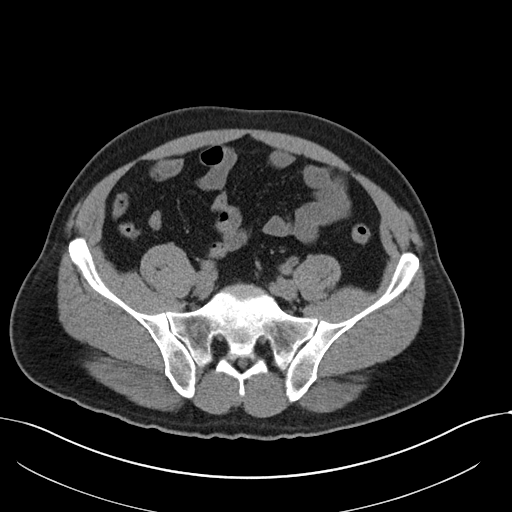
[im 45/104  soft-tissue]
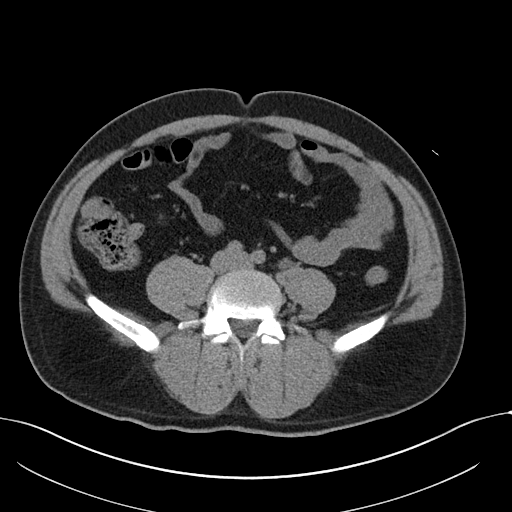
[im 54/104  soft-tissue]
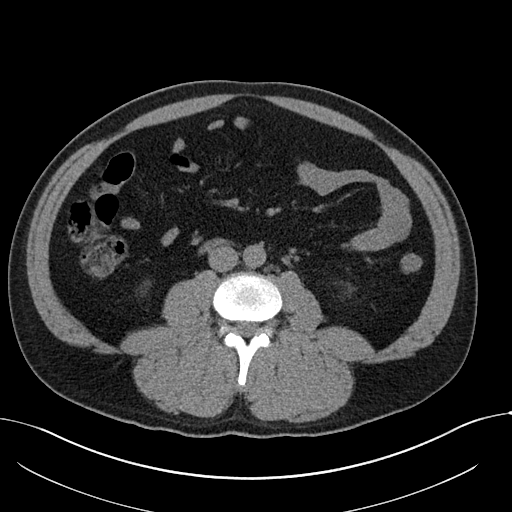
[im 59/104  soft-tissue]
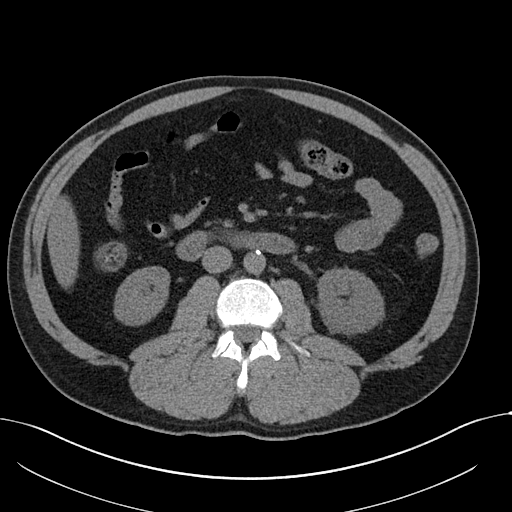
[im 68/104  soft-tissue]
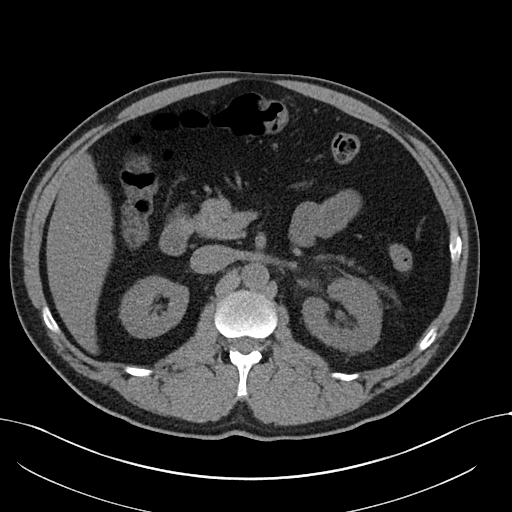
[im 68/104  bone]
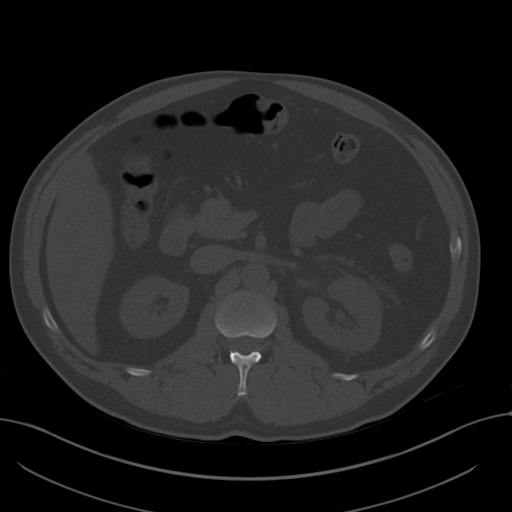
[im 77/104  soft-tissue]
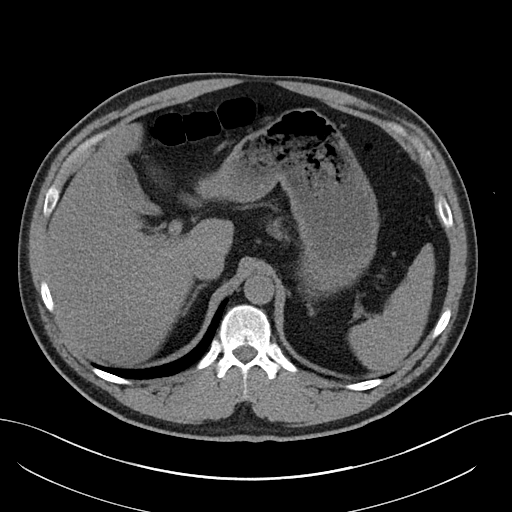
[im 81/104  soft-tissue]
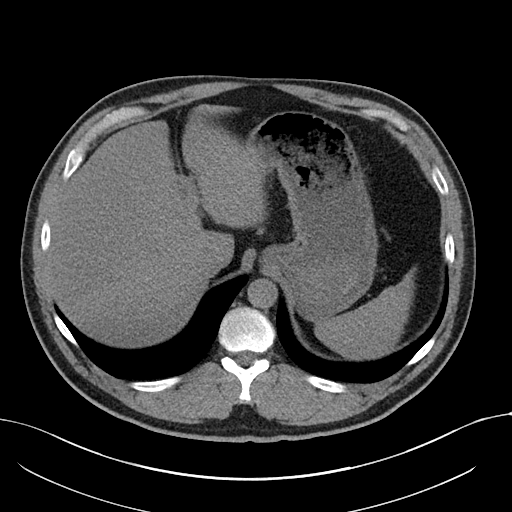
[im 90/104  soft-tissue]
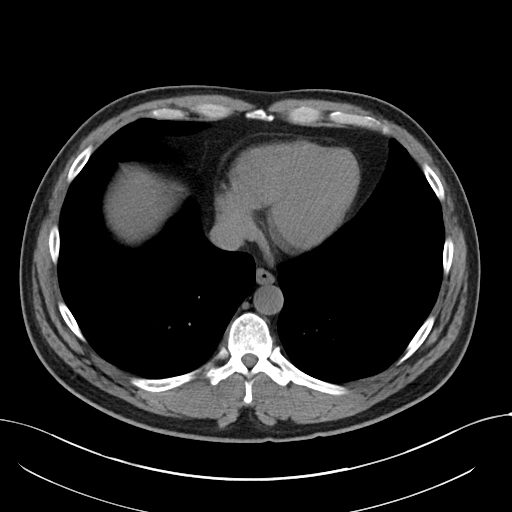
[im 99/104  soft-tissue]
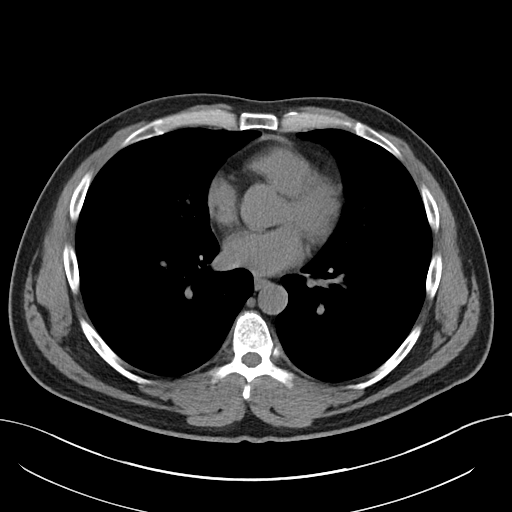

[Series 6: renal stone 3.0 cor · coronal · 0.87mm/px · 3 of 113 slices shown]
[im 38/113  soft-tissue]
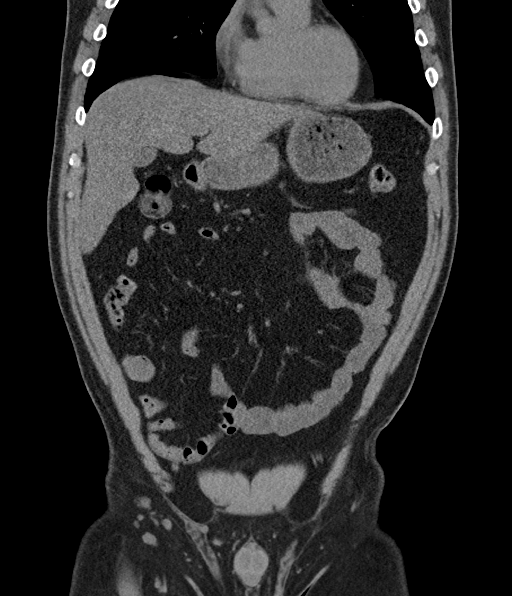
[im 50/113  soft-tissue]
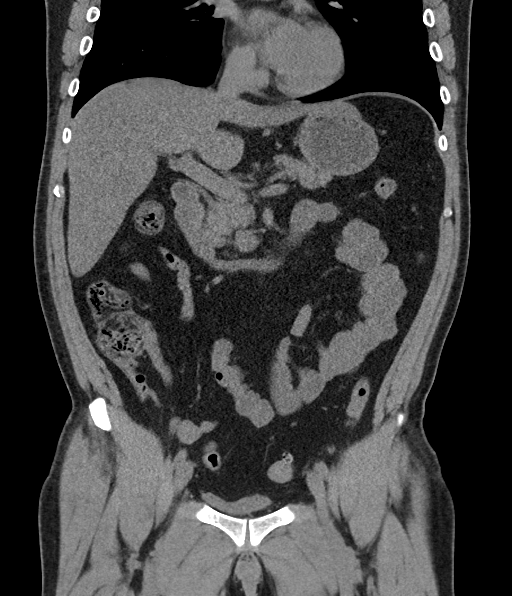
[im 63/113  soft-tissue]
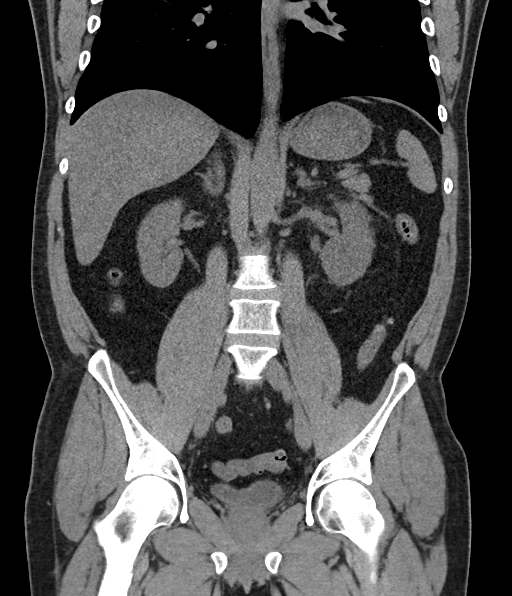

[16 of 46 positions shown; findings below may reference images not displayed]

FINDINGS: Lower chest: Mild scarring/atelectasis in the medial right middle
lobe.

Hepatobiliary: Moderate hepatic steatosis.

Gallbladder is unremarkable. No intrahepatic or extrahepatic ductal
dilatation.

Pancreas: Within normal limits.

Spleen: Within normal limits.

Adrenals/Urinary Tract: Adrenal glands are within normal limits.

Right kidney is within normal limits.

Punctate nonobstructing calculus in the left upper kidney (coronal
image 75). Mild left hydroureteronephrosis. Associated 5 x 3 mm
distal left ureteral calculus at the UVJ (series 3/image 86).

Bladder is underdistended.

Stomach/Bowel: Stomach is within normal limits.

No evidence of bowel obstruction.

Normal appendix (series 3/image 57).

Mild sigmoid diverticulosis, without evidence of diverticulitis.

Vascular/Lymphatic: No evidence of abdominal aortic aneurysm.

Atherosclerotic calcifications of the abdominal aorta and branch
vessels.

No suspicious abdominopelvic lymphadenopathy.

Reproductive: Prostate is unremarkable.

Other: No abdominopelvic ascites.

Tiny fat containing bilateral inguinal hernias (series 3/image 90).

Musculoskeletal: Visualized osseous structures are within normal
limits.
IMPRESSION: 5 x 3 mm distal left ureteral calculus at the UVJ. Mild left
hydroureteronephrosis.

Additional punctate nonobstructing left upper pole renal calculus.

Moderate hepatic steatosis.
